# Patient Record
Sex: Female | Born: 1975 | Hispanic: Yes | Marital: Single | Smoking: Never smoker
Health system: Southern US, Community
[De-identification: ages and names within clinical notes are randomized; demographics above are authoritative.]

## PROBLEM LIST (undated history)

## (undated) DIAGNOSIS — I10 Essential (primary) hypertension: Secondary | ICD-10-CM

## (undated) DIAGNOSIS — E119 Type 2 diabetes mellitus without complications: Secondary | ICD-10-CM

## (undated) DIAGNOSIS — K529 Noninfective gastroenteritis and colitis, unspecified: Secondary | ICD-10-CM

## (undated) HISTORY — PX: ABSCESS DRAINAGE: SHX1119

## (undated) HISTORY — PX: HERNIA REPAIR: SHX51

---

## 2006-04-22 ENCOUNTER — Emergency Department: Payer: Self-pay | Admitting: Emergency Medicine

## 2008-09-17 ENCOUNTER — Emergency Department: Payer: Self-pay | Admitting: Emergency Medicine
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
## 2011-09-05 ENCOUNTER — Emergency Department: Payer: Self-pay | Admitting: *Deleted
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
## 2013-05-23 LAB — HM PAP SMEAR

## 2013-05-23 LAB — HM HIV SCREENING LAB: HM HIV Screening: NEGATIVE

## 2013-10-26 DIAGNOSIS — N879 Dysplasia of cervix uteri, unspecified: Secondary | ICD-10-CM | POA: Insufficient documentation

## 2016-09-11 ENCOUNTER — Emergency Department: Payer: Self-pay

## 2016-09-11 ENCOUNTER — Encounter: Payer: Self-pay | Admitting: Emergency Medicine

## 2016-09-11 ENCOUNTER — Emergency Department
Admission: EM | Admit: 2016-09-11 | Discharge: 2016-09-11 | Disposition: A | Discharge status: Home / Self Care | Payer: Self-pay | Attending: Emergency Medicine | Admitting: Emergency Medicine

## 2016-09-11 DIAGNOSIS — I10 Essential (primary) hypertension: Secondary | ICD-10-CM | POA: Insufficient documentation

## 2016-09-11 DIAGNOSIS — R1013 Epigastric pain: Secondary | ICD-10-CM

## 2016-09-11 DIAGNOSIS — E119 Type 2 diabetes mellitus without complications: Secondary | ICD-10-CM | POA: Insufficient documentation

## 2016-09-11 DIAGNOSIS — R740 Nonspecific elevation of levels of transaminase and lactic acid dehydrogenase [LDH]: Secondary | ICD-10-CM | POA: Insufficient documentation

## 2016-09-11 DIAGNOSIS — R7401 Elevation of levels of liver transaminase levels: Secondary | ICD-10-CM

## 2016-09-11 HISTORY — DX: Type 2 diabetes mellitus without complications: E11.9

## 2016-09-11 HISTORY — DX: Essential (primary) hypertension: I10

## 2016-09-11 HISTORY — DX: Noninfective gastroenteritis and colitis, unspecified: K52.9

## 2016-09-11 LAB — CBC
HEMATOCRIT: 34.2 % — AB (ref 35.0–47.0)
HEMOGLOBIN: 11 g/dL — AB (ref 12.0–16.0)
MCH: 22.6 pg — ABNORMAL LOW (ref 26.0–34.0)
MCHC: 32.1 g/dL (ref 32.0–36.0)
MCV: 70.4 fL — ABNORMAL LOW (ref 80.0–100.0)
Platelets: 313 10*3/uL (ref 150–440)
RBC: 4.86 MIL/uL (ref 3.80–5.20)
RDW: 17 % — ABNORMAL HIGH (ref 11.5–14.5)
WBC: 7.7 10*3/uL (ref 3.6–11.0)

## 2016-09-11 LAB — URINALYSIS, COMPLETE (UACMP) WITH MICROSCOPIC
BILIRUBIN URINE: NEGATIVE
Glucose, UA: >=500 mg/dL — AB
HGB URINE DIPSTICK: NEGATIVE
Ketones, ur: NEGATIVE mg/dL
LEUKOCYTES UA: NEGATIVE
NITRITE: NEGATIVE
PROTEIN: NEGATIVE mg/dL
Specific Gravity, Urine: 1.038 — ABNORMAL HIGH (ref 1.005–1.030)
pH: 5 (ref 5.0–8.0)

## 2016-09-11 LAB — COMPREHENSIVE METABOLIC PANEL
ALBUMIN: 3.9 g/dL (ref 3.5–5.0)
ALT: 533 U/L — ABNORMAL HIGH (ref 14–54)
ANION GAP: 8 (ref 5–15)
AST: 486 U/L — ABNORMAL HIGH (ref 15–41)
Alkaline Phosphatase: 183 U/L — ABNORMAL HIGH (ref 38–126)
BILIRUBIN TOTAL: 0.5 mg/dL (ref 0.3–1.2)
BUN: 9 mg/dL (ref 6–20)
CO2: 24 mmol/L (ref 22–32)
Calcium: 9 mg/dL (ref 8.9–10.3)
Chloride: 102 mmol/L (ref 101–111)
Creatinine, Ser: 0.5 mg/dL (ref 0.44–1.00)
GFR calc Af Amer: >60 mL/min (ref >60)
GLUCOSE: 302 mg/dL — AB (ref 65–99)
POTASSIUM: 3.6 mmol/L (ref 3.5–5.1)
Sodium: 134 mmol/L — ABNORMAL LOW (ref 135–145)
TOTAL PROTEIN: 8.3 g/dL — AB (ref 6.5–8.1)

## 2016-09-11 LAB — PROTIME-INR
INR: 0.94
Prothrombin Time: 12.6 seconds (ref 11.4–15.2)

## 2016-09-11 LAB — LIPASE, BLOOD: LIPASE: 30 U/L (ref 11–51)

## 2016-09-11 LAB — POCT PREGNANCY, URINE: PREG TEST UR: NEGATIVE

## 2016-09-11 IMAGING — CR DG ABDOMEN 2V
1 series · 2 of 2 positions shown · non-contrast
Comparison: None.

CLINICAL DATA: Epigastric pain

EXAM:
ABDOMEN - 2 VIEW

[Series 1: dg abd 2 views · 0.14mm/px · 2 of 2 slices shown]
[im 1/2]
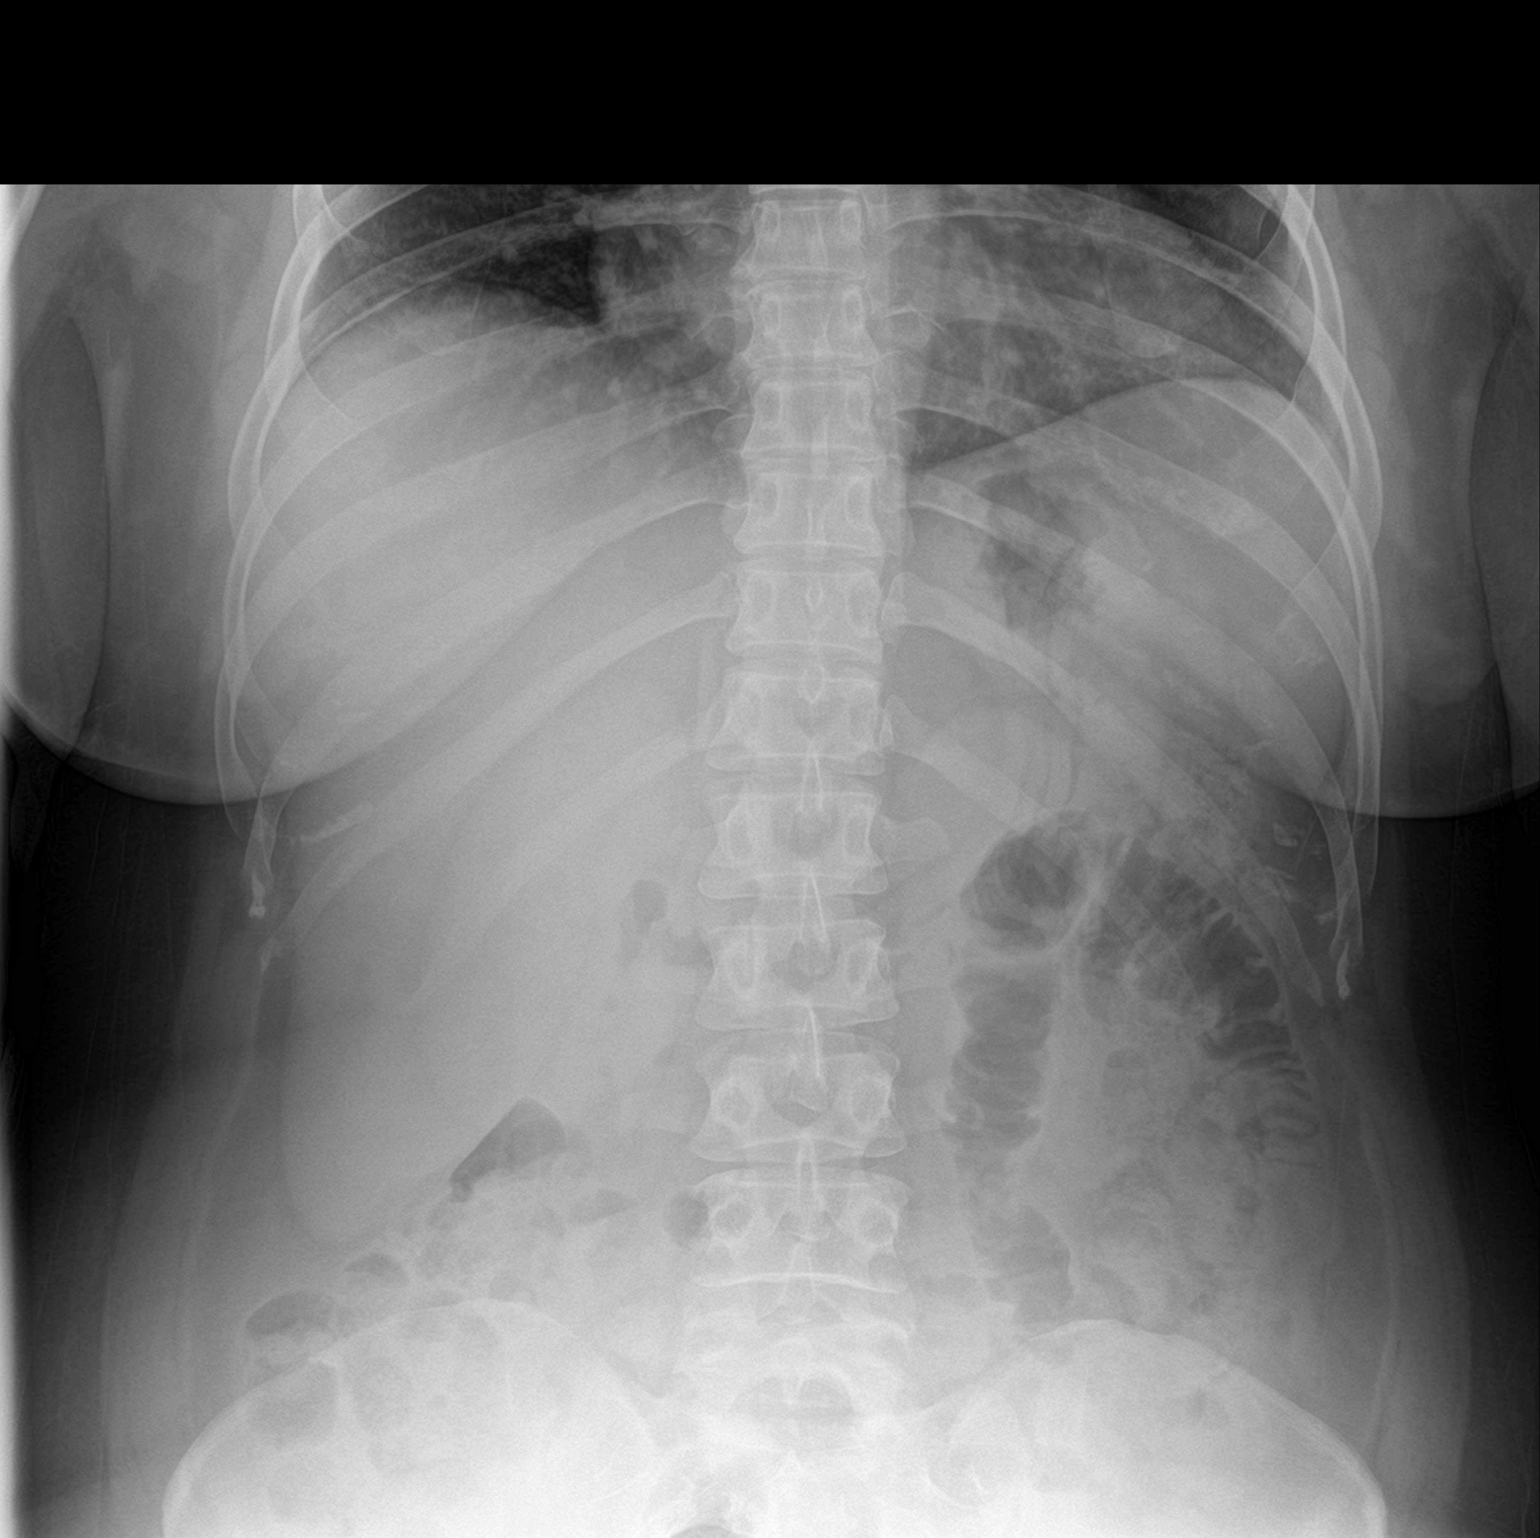
[im 2/2]
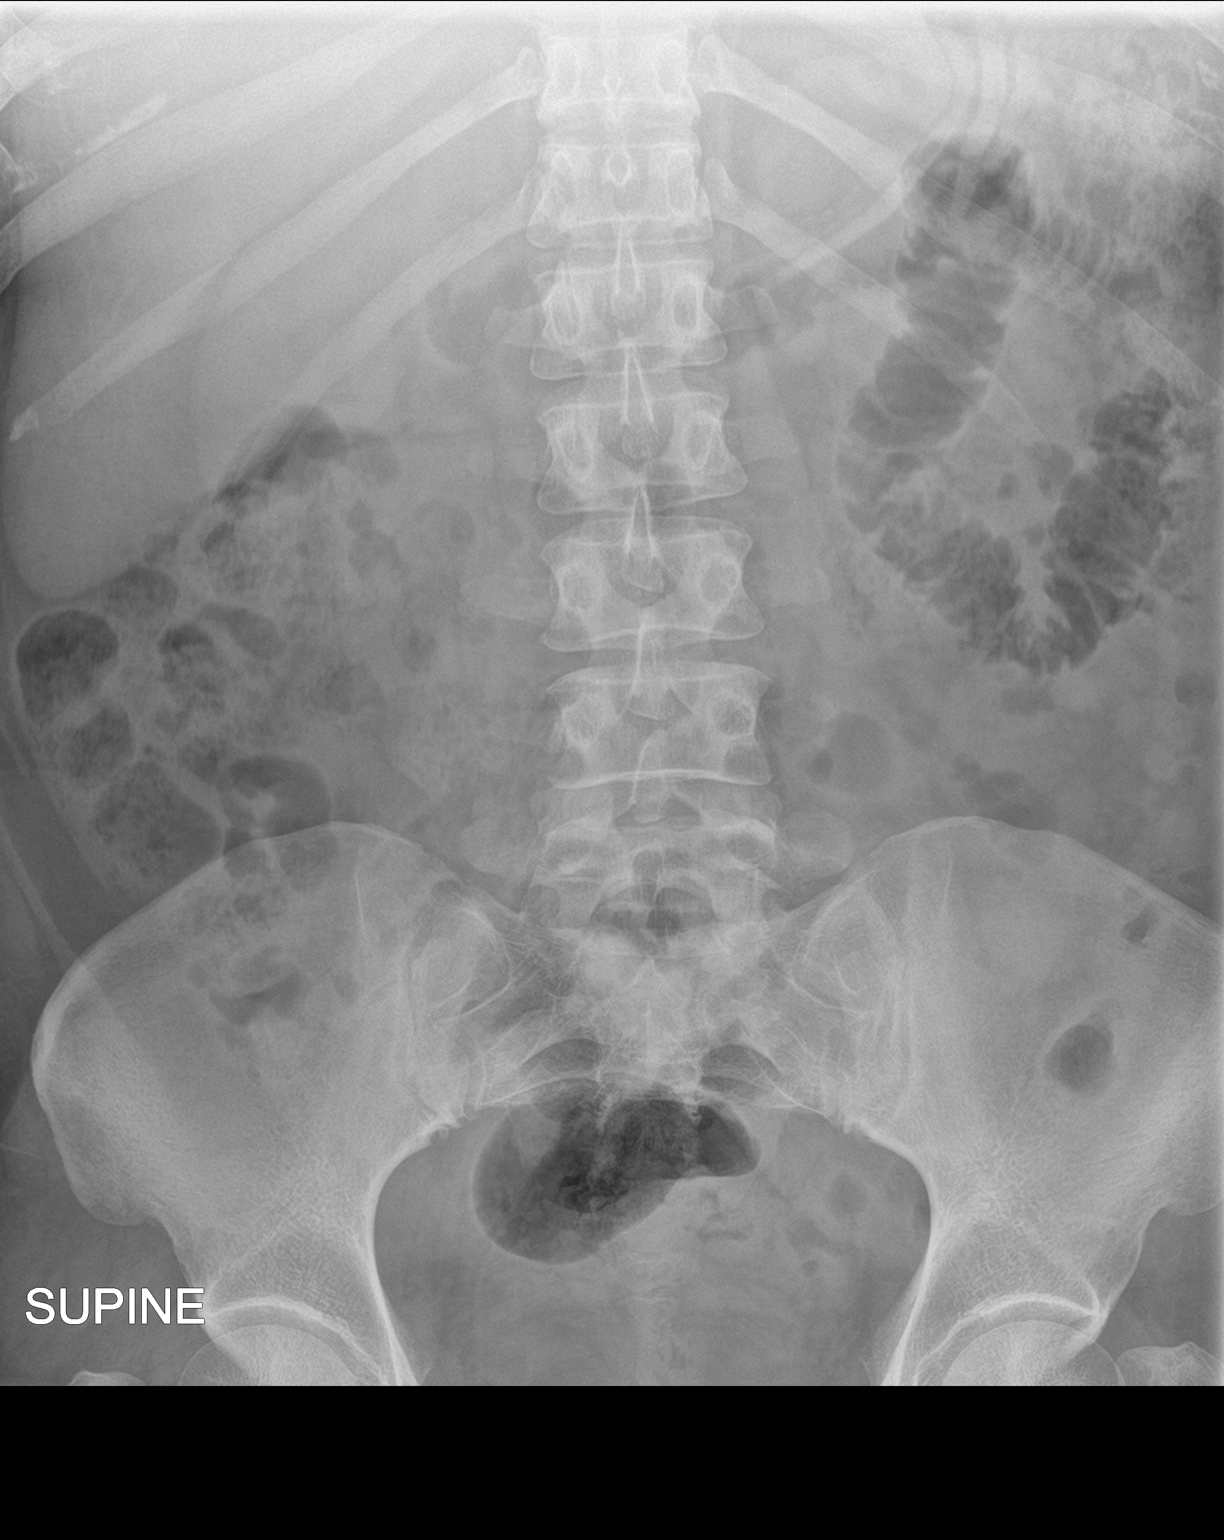

[2 of 2 positions shown; findings below may reference images not displayed]

FINDINGS: Lung bases are clear. The heart is slightly enlarged. No free air
beneath the diaphragm.

Mild gaseous enlargement of left upper quadrant small bowel but no
convincing evidence for bowel obstruction. Moderate stool in the
colon.
IMPRESSION: 1. Slight gaseous enlargement of left upper quadrant small bowel but
no definitive evidence for a bowel obstruction
2. Mild cardiomegaly

## 2016-09-11 IMAGING — US US ABDOMEN LIMITED
1 series · 14 of 25 positions shown · non-contrast
Comparison: None.

CLINICAL DATA: Epigastric pain.  Elevated LFTs.

EXAM:
ULTRASOUND ABDOMEN LIMITED RIGHT UPPER QUADRANT

[Series 1: us abdomen limited · 0.25mm/px · 14 of 51 slices shown]
[im 1/51]
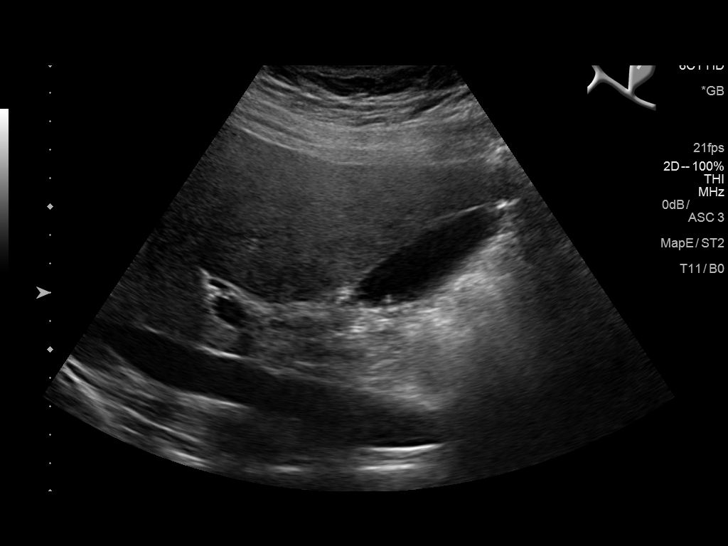
[im 5/51]
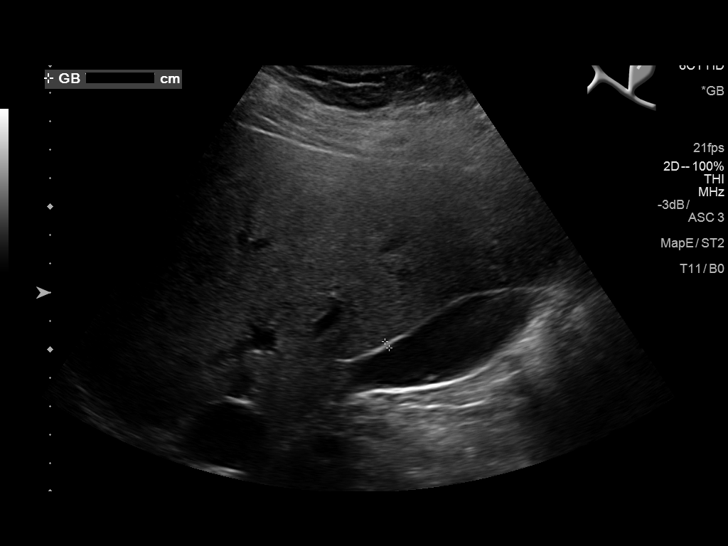
[im 9/51]
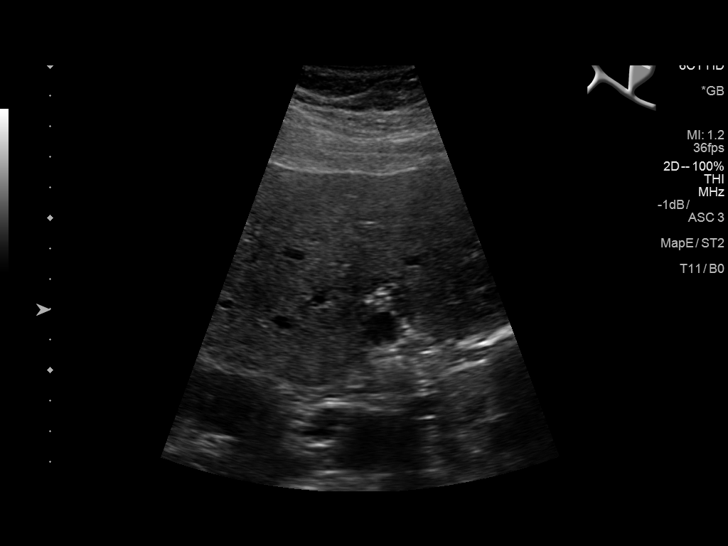
[im 13/51]
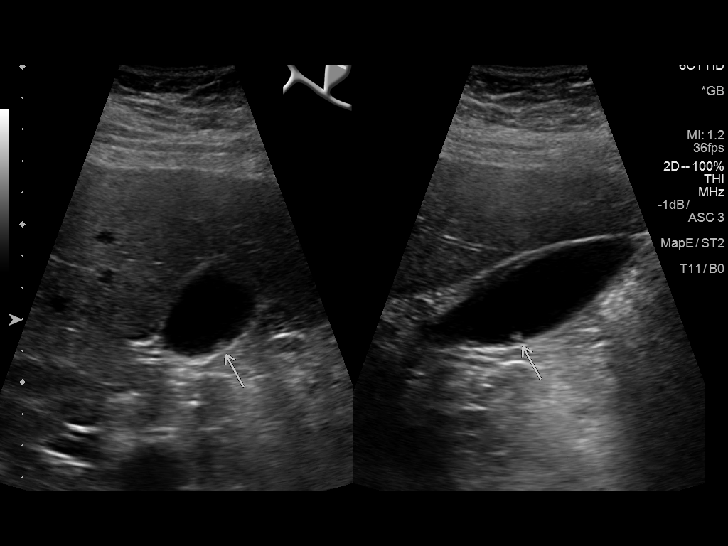
[im 17/51]
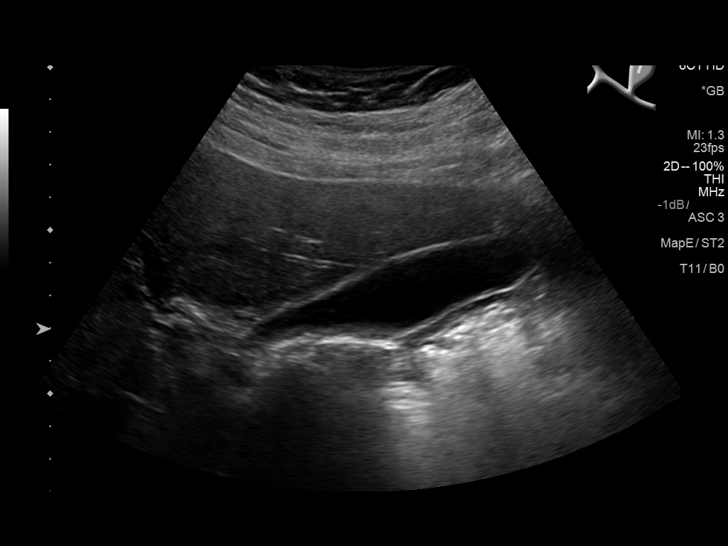
[im 19/51]
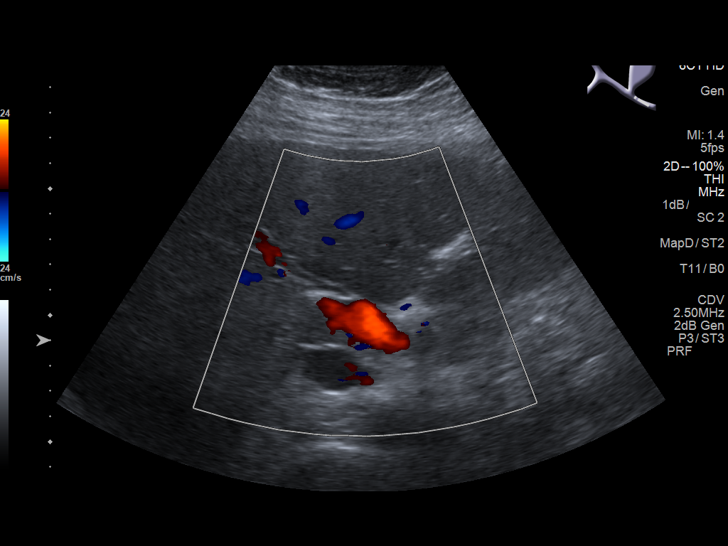
[im 23/51]
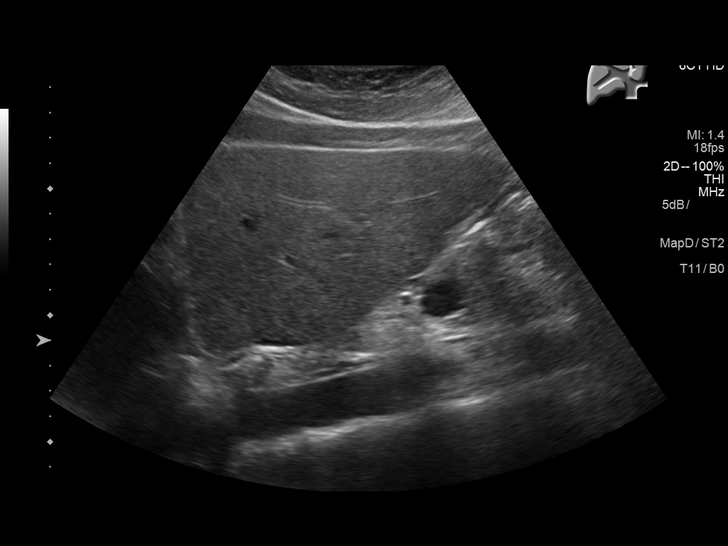
[im 28/51]
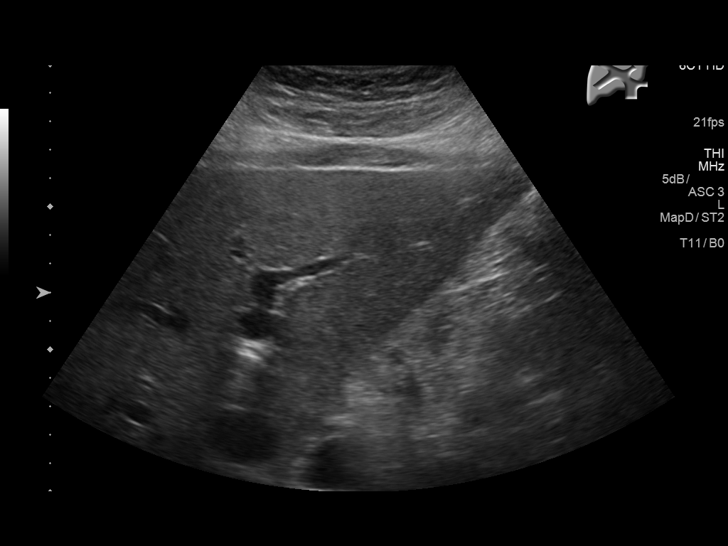
[im 32/51]
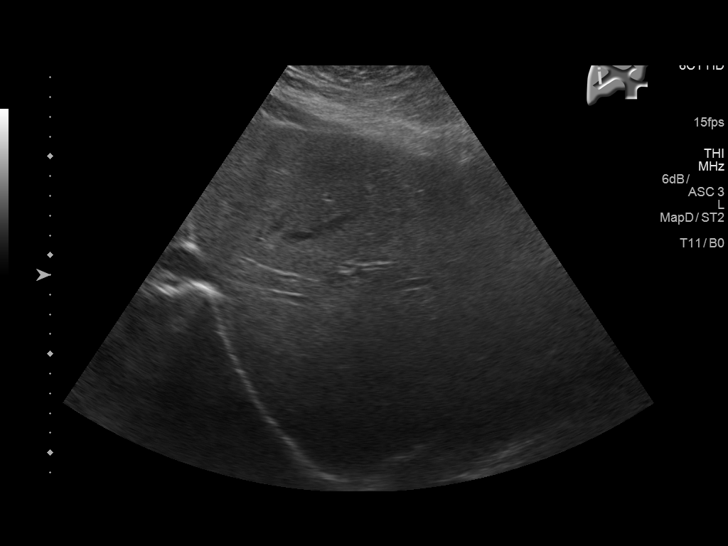
[im 34/51]
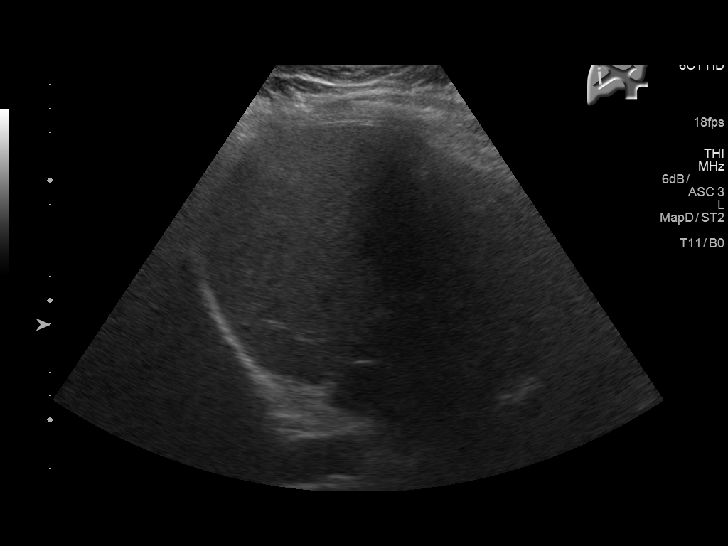
[im 38/51]
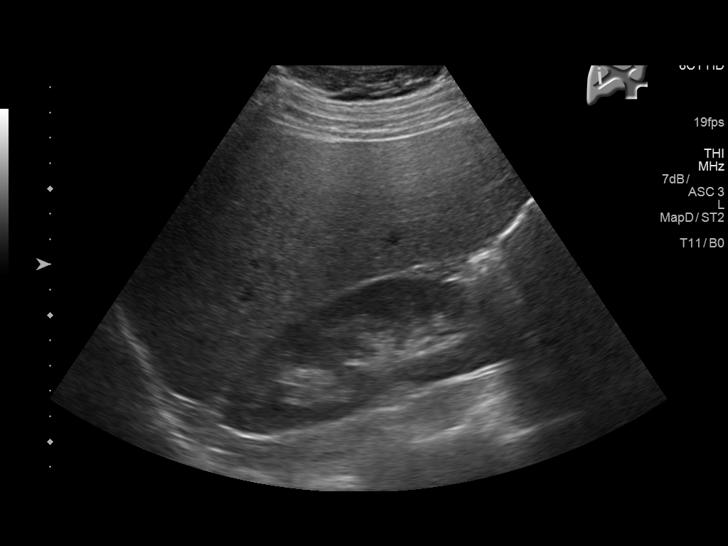
[im 42/51]
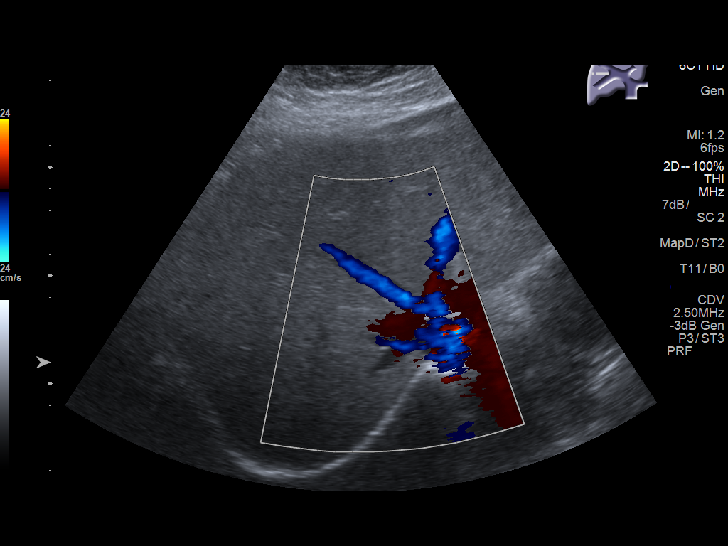
[im 46/51]
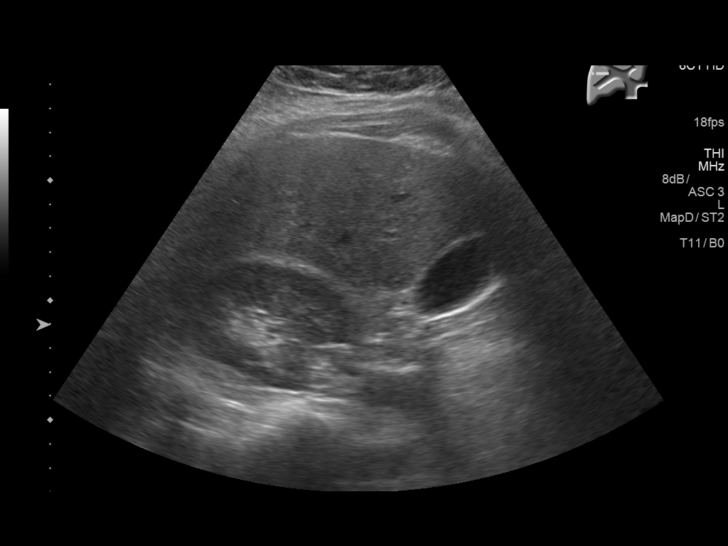
[im 51/51]
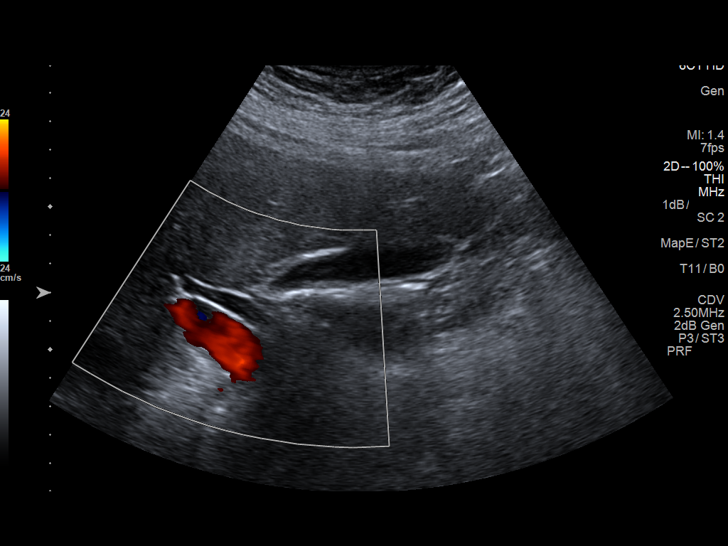

[14 of 25 positions shown; findings below may reference images not displayed]

FINDINGS: Gallbladder:

A 3.8 x 3.2 x 5.0 mm polyp is seen in the gallbladder. No stones or
sludge. No Murphy's sign. No wall thickening.

Common bile duct:

Diameter: 7.1 mm

Liver:

Probable hepatic steatosis.
IMPRESSION: 1. The common bile duct measures 7 mm which is mildly dilated in a
patient of this age. Recommend clinical correlation. If there is
concern for obstruction, recommend an MRCP.
2. 5 mm polyp in the gallbladder.
3. Probable hepatic steatosis.

## 2016-09-11 MED ORDER — OXYCODONE-ACETAMINOPHEN 5-325 MG PO TABS
2.0000 | ORAL_TABLET | Freq: Once | ORAL | Status: AC
  Administered 2016-09-11: 2 via ORAL
  Filled 2016-09-11: qty 2

## 2016-09-11 MED ORDER — PROMETHAZINE HCL 25 MG PO TABS: 25.0000 mg | ORAL_TABLET | Freq: Four times a day (QID) | ORAL | 0 refills | Status: DC | PRN

## 2016-09-11 MED ORDER — OXYCODONE HCL 5 MG PO CAPS: 5.0000 mg | ORAL_CAPSULE | ORAL | 0 refills | Status: DC | PRN

## 2016-09-11 MED ORDER — FAMOTIDINE 20 MG PO TABS: 20.0000 mg | ORAL_TABLET | Freq: Two times a day (BID) | ORAL | 1 refills | Status: DC

## 2016-09-11 MED ORDER — PANTOPRAZOLE SODIUM 40 MG PO TBEC: 40.0000 mg | DELAYED_RELEASE_TABLET | Freq: Every day | ORAL | 1 refills | Status: DC

## 2016-09-11 MED ORDER — ONDANSETRON 4 MG PO TBDP
4.0000 mg | ORAL_TABLET | Freq: Once | ORAL | Status: AC | PRN
  Administered 2016-09-11: 4 mg via ORAL
  Filled 2016-09-11: qty 1

## 2016-09-11 MED ORDER — TRAMADOL HCL 50 MG PO TABS: 50.0000 mg | ORAL_TABLET | Freq: Four times a day (QID) | ORAL | 0 refills | Status: AC | PRN

## 2016-09-11 MED ORDER — ONDANSETRON 4 MG PO TBDP: 4.0000 mg | ORAL_TABLET | Freq: Three times a day (TID) | ORAL | 0 refills | Status: DC | PRN

## 2016-09-11 MED ORDER — SUCRALFATE 1 G PO TABS: 1.0000 g | ORAL_TABLET | Freq: Four times a day (QID) | ORAL | 1 refills | Status: AC

## 2016-09-11 MED ORDER — GI COCKTAIL ~~LOC~~
30.0000 mL | Freq: Once | ORAL | Status: AC
  Administered 2016-09-11: 30 mL via ORAL
  Filled 2016-09-11: qty 30

## 2016-09-11 NOTE — ED Provider Notes (Signed)
[facility] Emergency Department Provider Note       Time seen: ----------------------------------------- 7:04 PM on 09/11/2016 -----------------------------------------     I have reviewed the triage vital signs and the nursing notes.   HISTORY   Chief Complaint Nausea and Abdominal Pain    HPI Shari Spears is a 41 y.o. female who presents to the ED for epigastric pain that began Saturday with nausea. Patient reports similar pain when she was diagnosed with colitis in [place] approximately one year ago. She doesn't history of diabetes and high blood pressure, states she was taking medication for blood pressure [place] but ran out. She denies any vomiting, has had some soft stools. Pain is currently 10 out of 10 in the abdomen.   Past Medical History:  Diagnosis Date  . Colitis   . Diabetes mellitus without complication (HCC)   . Hypertension     There are no active problems to display for this patient.   Past Surgical History:  Procedure Laterality Date  . HERNIA REPAIR      Allergies Patient has no known allergies.  Social History Social History  Substance Use Topics  . Smoking status: Never Smoker  . Smokeless tobacco: Never Used  . Alcohol use No    Review of Systems Constitutional: Negative for fever. Eyes: Negative for vision changes ENT:  Negative for congestion, sore throat Cardiovascular: Negative for chest pain. Respiratory: Negative for shortness of breath. Gastrointestinal: Positive for abdominal pain Genitourinary: Negative for dysuria. Musculoskeletal: Negative for back pain. Skin: Negative for rash. Neurological: Negative for headaches, focal weakness or numbness.  All systems negative/normal/unremarkable except as stated in the HPI  ____________________________________________   PHYSICAL EXAM:  VITAL SIGNS: ED Triage Vitals  Enc Vitals Group     BP 09/11/16 1635 (!) 154/93     Pulse Rate  09/11/16 1635 82     Resp 09/11/16 1635 20     Temp 09/11/16 1635 98.2 F (36.8 C)     Temp Source 09/11/16 1635 Oral     SpO2 09/11/16 1635 97 %     Weight 09/11/16 1636 180 lb (81.6 kg)     Height 09/11/16 1636 4\' 7"  (1.397 m)     Head Circumference --      Peak Flow --      Pain Score 09/11/16 1635 10     Pain Loc --      Pain Edu? --      Excl. in GC? --     Constitutional: Alert and oriented. Mild distress Eyes: Conjunctivae are normal. Normal extraocular movements. ENT   Head: Normocephalic and atraumatic.   Nose: No congestion/rhinnorhea.   Mouth/Throat: Mucous membranes are moist.   Neck: No stridor. Cardiovascular: Normal rate, regular rhythm. No murmurs, rubs, or gallops. Respiratory: Normal respiratory effort without tachypnea nor retractions. Breath sounds are clear and equal bilaterally. No wheezes/rales/rhonchi. Gastrointestinal: Nonfocal tenderness, no rebound or guarding. Normal bowel sounds. Musculoskeletal: Nontender with normal range of motion in extremities. No lower extremity tenderness nor edema. Neurologic:  Normal speech and language. No gross focal neurologic deficits are appreciated.  Skin:  Skin is warm, dry and intact. No rash noted. Psychiatric: Mood and affect are normal. Speech and behavior are normal.  ____________________________________________  ED COURSE:  Pertinent labs & imaging results that were available during my care of the patient were reviewed by me and considered in my medical decision making (see chart for details). Patient presents for abdominal pain, we will assess  with labs and imaging as indicated.   Procedures ____________________________________________   LABS (pertinent positives/negatives)  Labs Reviewed  COMPREHENSIVE METABOLIC PANEL - Abnormal; Notable for the following:       Result Value   Sodium 134 (*)    Glucose, Bld 302 (*)    Total Protein 8.3 (*)    AST 486 (*)    ALT 533 (*)    Alkaline  Phosphatase 183 (*)    All other components within normal limits  CBC - Abnormal; Notable for the following:    Hemoglobin 11.0 (*)    HCT 34.2 (*)    MCV 70.4 (*)    MCH 22.6 (*)    RDW 17.0 (*)    All other components within normal limits  URINALYSIS, COMPLETE (UACMP) WITH MICROSCOPIC - Abnormal; Notable for the following:    Color, Urine YELLOW (*)    APPearance HAZY (*)    Specific Gravity, Urine 1.038 (*)    Glucose, UA >=500 (*)    Bacteria, UA RARE (*)    Squamous Epithelial / LPF 0-5 (*)    All other components within normal limits  LIPASE, BLOOD  PROTIME-INR  HEPATITIS PANEL, ACUTE  POCT PREGNANCY, URINE    RADIOLOGY Images were viewed by me  Abdominal ultrasound IMPRESSION: 1. The common bile duct measures 7 mm which is mildly dilated in a patient of this age. Recommend clinical correlation. If there is concern for obstruction, recommend an MRCP. 2. 5 mm polyp in the gallbladder. 3. Probable hepatic steatosis. IMPRESSION: 1. Slight gaseous enlargement of left upper quadrant small bowel but no definitive evidence for a bowel obstruction 2. Mild cardiomegaly  ____________________________________________  FINAL ASSESSMENT AND PLAN  Epigastric pain, Elevated liver transaminases  Plan: Patient's labs and imaging were dictated above. Patient had presented for epigastric pain of uncertain etiology. Her symptoms seemed to be GERD or peptic ulcer related but she was found to have some findings for which I don't have an explanation. Common bile duct appears to be mildly dilated with a normal bilirubin and no right upper quadrant tenderness. Liver transaminases are also elevated. Her symptoms are also resolved at this point so I think she is stable for outpatient follow-up with GI. I will place her on antacid as well as give her pain medication and I have sent a hepatitis panel.   Emily FilbertWilliams, Jonathan E, MD   Note: This note was generated in part or whole with voice  recognition software. Voice recognition is usually quite accurate but there are transcription errors that can and very often do occur. I apologize for any typographical errors that were not detected and corrected.     Emily FilbertWilliams, Jonathan E, MD 09/11/16 2111

## 2016-09-11 NOTE — ED Triage Notes (Signed)
Patient presents to the ED with epigastric pain that began Saturday with nausea.  Patient reports similar pain when she was diagnosed with colitis in [place] approx. 1 year ago.  Patient also reports history of diabetes and high blood pressure.  Patient states she was taking medication for her blood pressure in [place] but patient states she ran out.  Patient is in no obvious distress at this time.  Denies vomiting for the past few days.  Patient reports some soft stools, 1 episode today.

## 2016-09-13 LAB — HEPATITIS PANEL, ACUTE
HCV Ab: <0.1 s/co ratio (ref 0.0–0.9)
HEP A IGM: NEGATIVE
HEP B C IGM: NEGATIVE
Hepatitis B Surface Ag: NEGATIVE

## 2018-08-24 DIAGNOSIS — B009 Herpesviral infection, unspecified: Secondary | ICD-10-CM | POA: Insufficient documentation

## 2018-10-07 DIAGNOSIS — E669 Obesity, unspecified: Secondary | ICD-10-CM | POA: Insufficient documentation

## 2019-02-25 ENCOUNTER — Emergency Department: Payer: Medicaid Other

## 2019-02-25 ENCOUNTER — Other Ambulatory Visit: Payer: Self-pay

## 2019-02-25 ENCOUNTER — Emergency Department
Admission: EM | Admit: 2019-02-25 | Discharge: 2019-02-25 | Disposition: A | Discharge status: Other Acute Inpatient Hospital | Payer: Medicaid Other | Attending: Emergency Medicine | Admitting: Emergency Medicine

## 2019-02-25 DIAGNOSIS — Z79899 Other long term (current) drug therapy: Secondary | ICD-10-CM | POA: Insufficient documentation

## 2019-02-25 DIAGNOSIS — Y763 Surgical instruments, materials and obstetric and gynecological devices (including sutures) associated with adverse incidents: Secondary | ICD-10-CM | POA: Diagnosis not present

## 2019-02-25 DIAGNOSIS — Z20828 Contact with and (suspected) exposure to other viral communicable diseases: Secondary | ICD-10-CM | POA: Insufficient documentation

## 2019-02-25 DIAGNOSIS — R509 Fever, unspecified: Secondary | ICD-10-CM | POA: Diagnosis present

## 2019-02-25 DIAGNOSIS — T8130XA Disruption of wound, unspecified, initial encounter: Secondary | ICD-10-CM

## 2019-02-25 DIAGNOSIS — O900 Disruption of cesarean delivery wound: Secondary | ICD-10-CM | POA: Diagnosis not present

## 2019-02-25 DIAGNOSIS — E119 Type 2 diabetes mellitus without complications: Secondary | ICD-10-CM | POA: Diagnosis not present

## 2019-02-25 DIAGNOSIS — O2413 Pre-existing diabetes mellitus, type 2, in the puerperium: Secondary | ICD-10-CM | POA: Insufficient documentation

## 2019-02-25 DIAGNOSIS — K659 Peritonitis, unspecified: Secondary | ICD-10-CM

## 2019-02-25 DIAGNOSIS — O1093 Unspecified pre-existing hypertension complicating the puerperium: Secondary | ICD-10-CM | POA: Diagnosis not present

## 2019-02-25 DIAGNOSIS — T8140XA Infection following a procedure, unspecified, initial encounter: Secondary | ICD-10-CM | POA: Diagnosis not present

## 2019-02-25 DIAGNOSIS — O99893 Other specified diseases and conditions complicating puerperium: Secondary | ICD-10-CM | POA: Insufficient documentation

## 2019-02-25 DIAGNOSIS — A419 Sepsis, unspecified organism: Secondary | ICD-10-CM | POA: Diagnosis not present

## 2019-02-25 DIAGNOSIS — T8149XA Infection following a procedure, other surgical site, initial encounter: Secondary | ICD-10-CM

## 2019-02-25 LAB — CBC WITH DIFFERENTIAL/PLATELET
Abs Immature Granulocytes: 0.11 10*3/uL — ABNORMAL HIGH (ref 0.00–0.07)
Basophils Absolute: 0 10*3/uL (ref 0.0–0.1)
Basophils Relative: 0 %
Eosinophils Absolute: 0 10*3/uL (ref 0.0–0.5)
Eosinophils Relative: 0 %
HCT: 24.5 % — ABNORMAL LOW (ref 36.0–46.0)
Hemoglobin: 7.6 g/dL — ABNORMAL LOW (ref 12.0–15.0)
Immature Granulocytes: 1 %
Lymphocytes Relative: 12 %
Lymphs Abs: 1.7 10*3/uL (ref 0.7–4.0)
MCH: 24.8 pg — ABNORMAL LOW (ref 26.0–34.0)
MCHC: 31 g/dL (ref 30.0–36.0)
MCV: 79.8 fL — ABNORMAL LOW (ref 80.0–100.0)
Monocytes Absolute: 0.8 10*3/uL (ref 0.1–1.0)
Monocytes Relative: 5 %
Neutro Abs: 12.3 10*3/uL — ABNORMAL HIGH (ref 1.7–7.7)
Neutrophils Relative %: 82 %
Platelets: 457 10*3/uL — ABNORMAL HIGH (ref 150–400)
RBC: 3.07 MIL/uL — ABNORMAL LOW (ref 3.87–5.11)
RDW: 20.9 % — ABNORMAL HIGH (ref 11.5–15.5)
WBC: 15 10*3/uL — ABNORMAL HIGH (ref 4.0–10.5)
nRBC: 0.5 % — ABNORMAL HIGH (ref 0.0–0.2)

## 2019-02-25 LAB — URINALYSIS, COMPLETE (UACMP) WITH MICROSCOPIC
Bacteria, UA: NONE SEEN
Bilirubin Urine: NEGATIVE
Glucose, UA: 50 mg/dL — AB
Hgb urine dipstick: NEGATIVE
Ketones, ur: NEGATIVE mg/dL
Nitrite: NEGATIVE
Protein, ur: NEGATIVE mg/dL
Specific Gravity, Urine: 1.038 — ABNORMAL HIGH (ref 1.005–1.030)
pH: 6 (ref 5.0–8.0)

## 2019-02-25 LAB — LIPASE, BLOOD: Lipase: 21 U/L (ref 11–51)

## 2019-02-25 LAB — BRAIN NATRIURETIC PEPTIDE: B Natriuretic Peptide: 39 pg/mL (ref 0.0–100.0)

## 2019-02-25 LAB — COMPREHENSIVE METABOLIC PANEL
ALT: 21 U/L (ref 0–44)
AST: 16 U/L (ref 15–41)
Albumin: 2.8 g/dL — ABNORMAL LOW (ref 3.5–5.0)
Alkaline Phosphatase: 153 U/L — ABNORMAL HIGH (ref 38–126)
Anion gap: 7 (ref 5–15)
BUN: 15 mg/dL (ref 6–20)
CO2: 20 mmol/L — ABNORMAL LOW (ref 22–32)
Calcium: 8.1 mg/dL — ABNORMAL LOW (ref 8.9–10.3)
Chloride: 107 mmol/L (ref 98–111)
Creatinine, Ser: 0.51 mg/dL (ref 0.44–1.00)
GFR calc Af Amer: >60 mL/min (ref >60)
GFR calc non Af Amer: >60 mL/min (ref >60)
Glucose, Bld: 227 mg/dL — ABNORMAL HIGH (ref 70–99)
Potassium: 3.7 mmol/L (ref 3.5–5.1)
Sodium: 134 mmol/L — ABNORMAL LOW (ref 135–145)
Total Bilirubin: 0.4 mg/dL (ref 0.3–1.2)
Total Protein: 7.4 g/dL (ref 6.5–8.1)

## 2019-02-25 LAB — RESPIRATORY PANEL BY RT PCR (FLU A&B, COVID)
Influenza A by PCR: NEGATIVE
Influenza B by PCR: NEGATIVE
SARS Coronavirus 2 by RT PCR: NEGATIVE

## 2019-02-25 LAB — LACTIC ACID, PLASMA
Lactic Acid, Venous: 1.2 mmol/L (ref 0.5–1.9)
Lactic Acid, Venous: 1.1 mmol/L (ref 0.5–1.9)

## 2019-02-25 LAB — PROCALCITONIN: Procalcitonin: <0.1 ng/mL

## 2019-02-25 IMAGING — CT CT ABD-PELV W/ CM
2 of 5 series · 16 of 46 positions shown, 18 images · IV contrast (APPLIED)
Comparison: None.

CLINICAL DATA: Acute generalized abdominal pain. Eight days post
cesarean section. Leaking clear fluid from right-sided incision.

EXAM:
CT ABDOMEN AND PELVIS WITH CONTRAST
TECHNIQUE: Multidetector CT imaging of the abdomen and pelvis was performed
using the standard protocol following bolus administration of
intravenous contrast.
CONTRAST:  100mL OMNIPAQUE IOHEXOL 300 MG/ML  SOLN

[Series 2: routine abd/pel with · axial · 0.92mm/px · z∈[-1302,-872]mm · 13 of 100 slices shown, 15 images]
[im 7/100  soft-tissue]
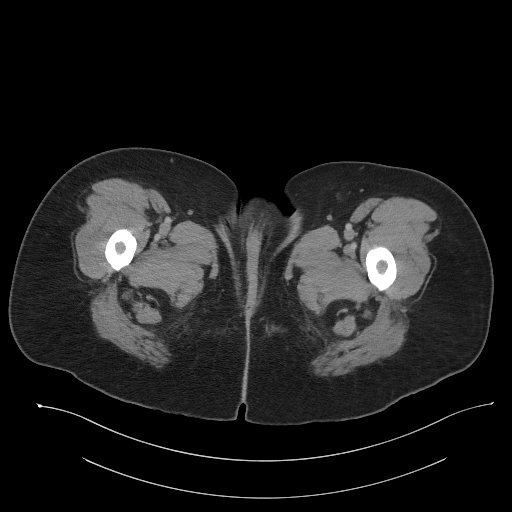
[im 7/100  bone]
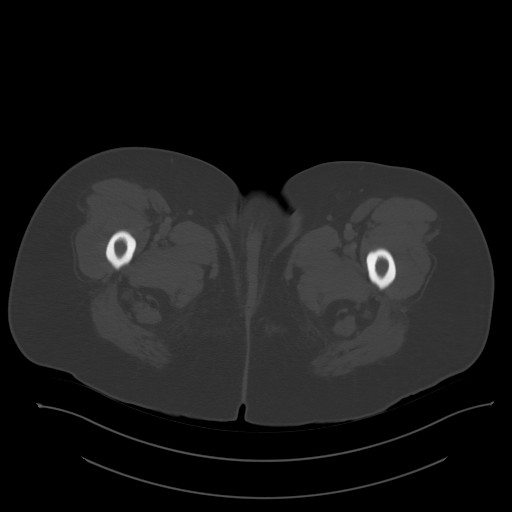
[im 14/100  soft-tissue]
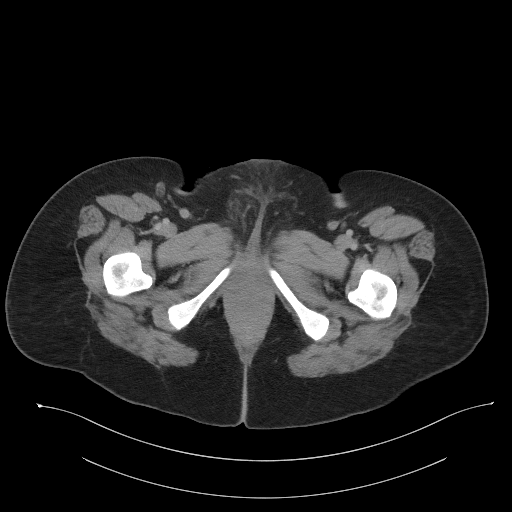
[im 20/100  soft-tissue]
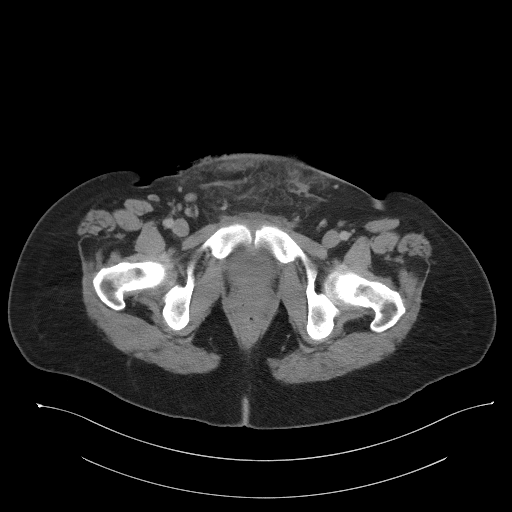
[im 27/100  soft-tissue]
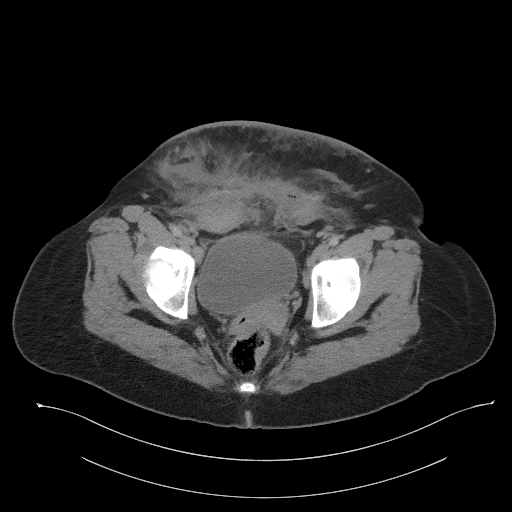
[im 34/100  soft-tissue]
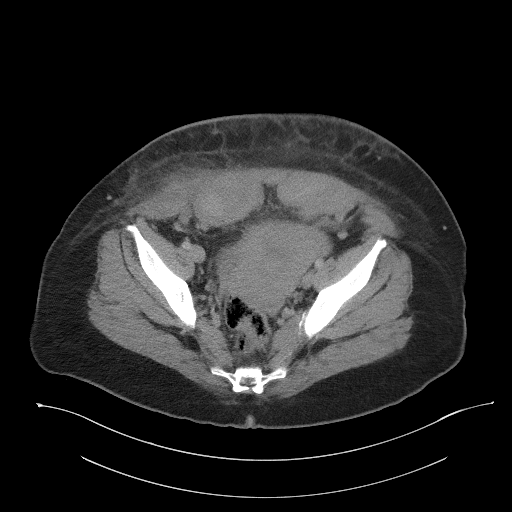
[im 40/100  soft-tissue]
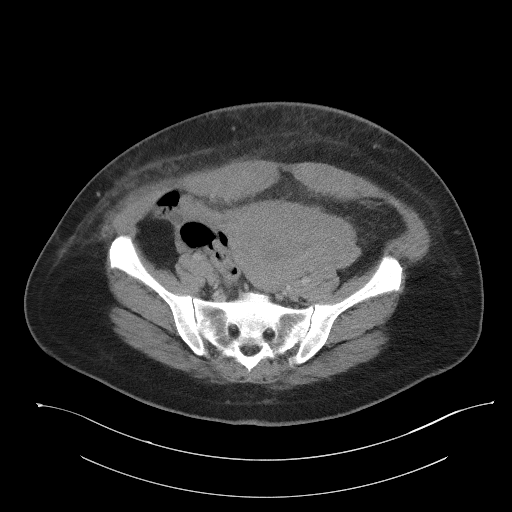
[im 53/100  soft-tissue]
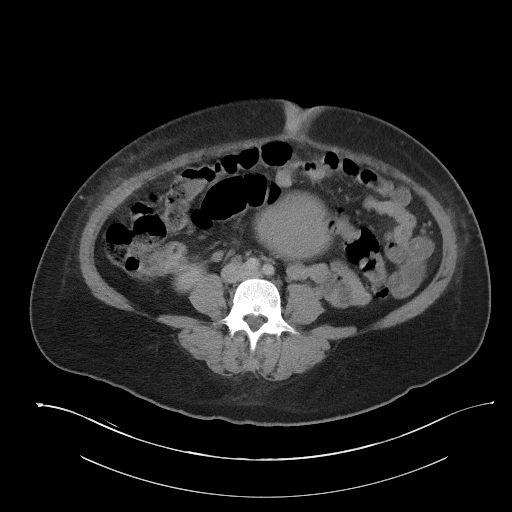
[im 60/100  soft-tissue]
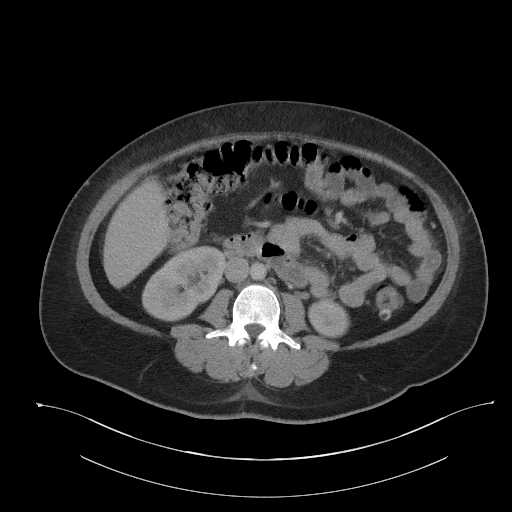
[im 67/100  soft-tissue]
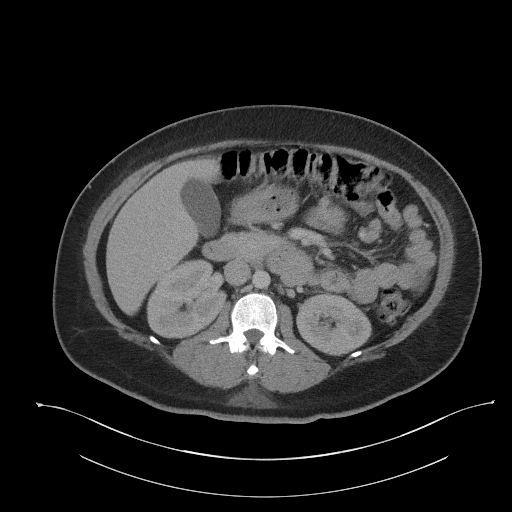
[im 67/100  bone]
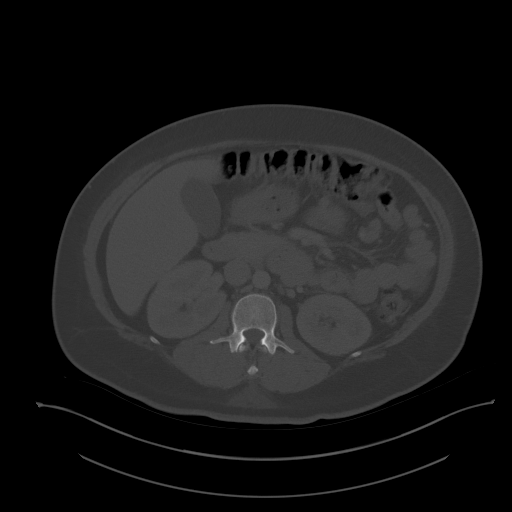
[im 73/100  soft-tissue]
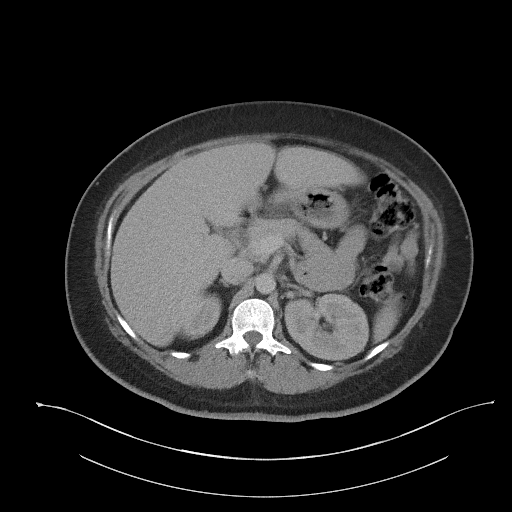
[im 80/100  soft-tissue]
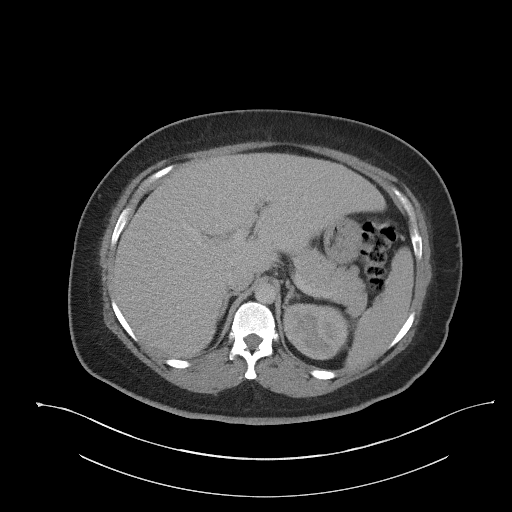
[im 86/100  soft-tissue]
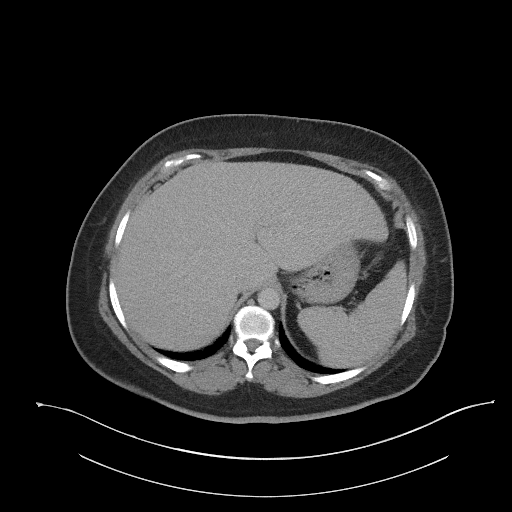
[im 93/100  soft-tissue]
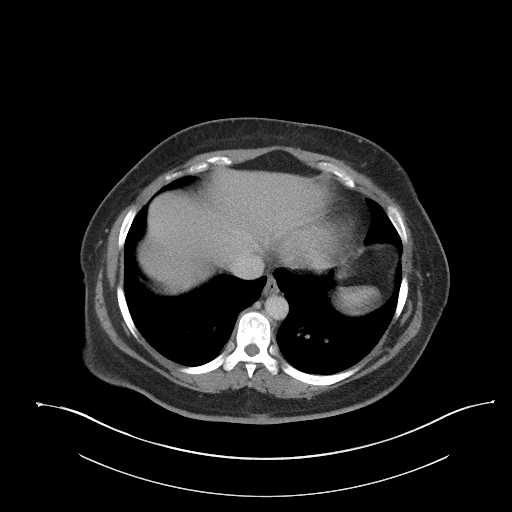

[Series 5: coronal st · coronal · 0.83mm/px · 3 of 96 slices shown]
[im 32/96  soft-tissue]
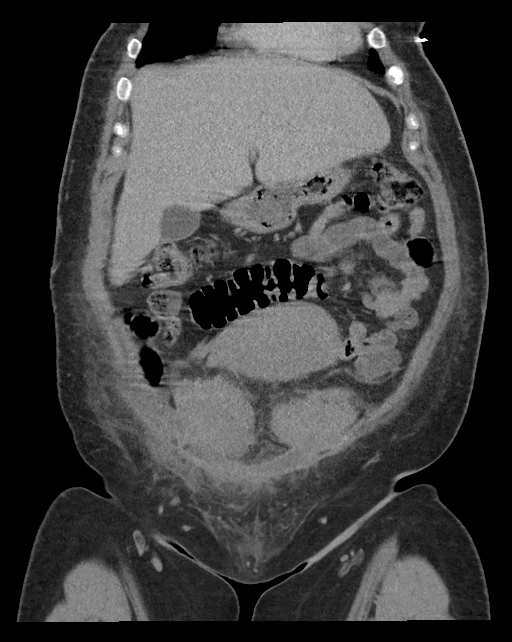
[im 43/96  soft-tissue]
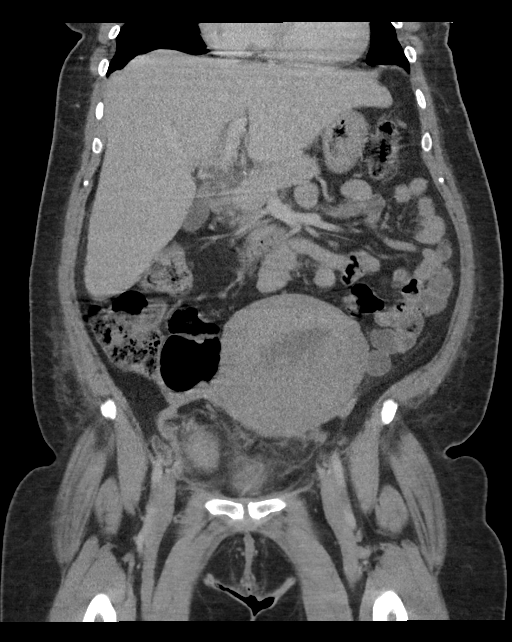
[im 53/96  soft-tissue]
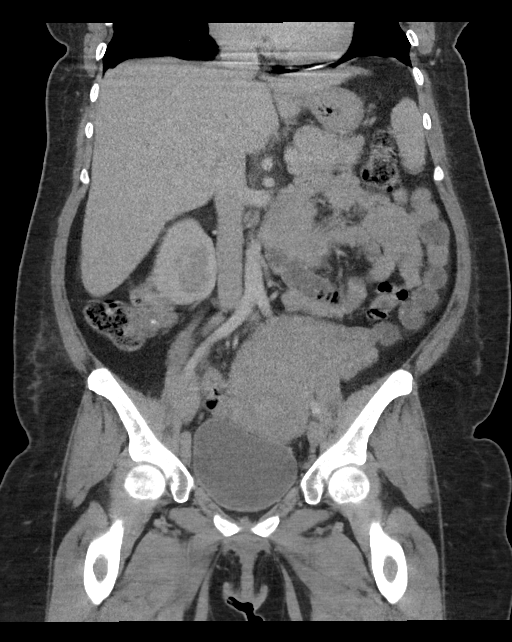

[16 of 46 positions shown; findings below may reference images not displayed]

FINDINGS: Lower chest:  No contributory findings.

Hepatobiliary: No focal liver abnormality.No evidence of biliary
obstruction or stone.

Pancreas: Unremarkable.

Spleen: Unremarkable.

Adrenals/Urinary Tract: Negative adrenals. No hydronephrosis or
stone. Unremarkable bladder.

Stomach/Bowel:  No obstruction. No appendicitis.

Vascular/Lymphatic: No acute vascular abnormality. No mass or
adenopathy.

Reproductive:Large uterus in keeping with recent delivery. Some
high-density material is in the lower uterine segment compatible
with blood clot, expected. No endometrial gas. High-density
thickening at the level of the abdominal wall incision with the
hemorrhage indistinguishable from the bilateral lower rectus
abdominus, larger on the right with a 8 cm craniocaudal extent and
approximately 4 cm anterior to posterior thickness. There is patchy
subcutaneous stranding in the right more than left subcutaneous
space, the presumed site of leakage. Sterility is indeterminate,
aside from the hematoma is no organized collection. A transverse
band of high-density is attributed to suture material as it follows
the deep fascial air. No evidence of active hemorrhage.

Other: No ascites or pneumoperitoneum.

Musculoskeletal: No acute abnormalities.
IMPRESSION: Recent cesarean section with hematomas in the bilateral rectus
sheath. There is some liquefaction of the hematomas with abdominal
wall stranding, the presumed cause of wound leakage.

## 2019-02-25 MED ORDER — VANCOMYCIN HCL IN DEXTROSE 750-5 MG/150ML-% IV SOLN: 750.0000 mg | Freq: Two times a day (BID) | INTRAVENOUS | Status: DC

## 2019-02-25 MED ORDER — SODIUM CHLORIDE 0.9 % IV BOLUS (SEPSIS)
1000.0000 mL | Freq: Once | INTRAVENOUS | Status: AC
  Administered 2019-02-25: 04:00:00 1000 mL via INTRAVENOUS

## 2019-02-25 MED ORDER — METRONIDAZOLE IN NACL 5-0.79 MG/ML-% IV SOLN
500.0000 mg | Freq: Once | INTRAVENOUS | Status: AC
  Administered 2019-02-25: 500 mg via INTRAVENOUS
  Filled 2019-02-25: qty 100

## 2019-02-25 MED ORDER — VANCOMYCIN HCL IN DEXTROSE 1-5 GM/200ML-% IV SOLN
1000.0000 mg | Freq: Once | INTRAVENOUS | Status: AC
  Administered 2019-02-25: 04:00:00 1000 mg via INTRAVENOUS
  Filled 2019-02-25: qty 200

## 2019-02-25 MED ORDER — SODIUM CHLORIDE 0.9 % IV SOLN
2.0000 g | INTRAVENOUS | Status: AC
  Administered 2019-02-25: 04:00:00 2 g via INTRAVENOUS
  Filled 2019-02-25: qty 20

## 2019-02-25 MED ORDER — MORPHINE SULFATE (PF) 4 MG/ML IV SOLN
4.0000 mg | Freq: Once | INTRAVENOUS | Status: AC
  Administered 2019-02-25: 4 mg via INTRAVENOUS
  Filled 2019-02-25: qty 1

## 2019-02-25 MED ORDER — ONDANSETRON HCL 4 MG/2ML IJ SOLN
4.0000 mg | INTRAMUSCULAR | Status: AC
  Administered 2019-02-25: 4 mg via INTRAVENOUS
  Filled 2019-02-25: qty 2

## 2019-02-25 MED ORDER — ACETAMINOPHEN 500 MG PO TABS
1000.0000 mg | ORAL_TABLET | Freq: Once | ORAL | Status: AC
  Administered 2019-02-25: 1000 mg via ORAL
  Filled 2019-02-25: qty 2

## 2019-02-25 MED ORDER — IOHEXOL 300 MG/ML  SOLN
100.0000 mL | Freq: Once | INTRAMUSCULAR | Status: AC | PRN
  Administered 2019-02-25: 100 mL via INTRAVENOUS

## 2019-02-25 NOTE — Progress Notes (Signed)
Pharmacy Antibiotic Note  Shari Spears is a 43 y.o. female admitted on 02/25/2019 with cellulitis.  Pharmacy has been consulted for Vancomycin dosing. Pt received Vancomycin 1gm in ED. Noted:  Intra-abdominal - 8 days after C-section, now with obvious post-op infection (draining purulence from wound dehiscence)  Plan: Vancomycin 750 mg IV Q 12 hrs. Goal AUC 400-550. Expected AUC: 519.1 SCr used: 0.8   Height: 5' (152.4 cm) Weight: 150 lb (68 kg) IBW/kg (Calculated) : 45.5  Temp (24hrs), Avg:99.7 F (37.6 C), Min:99.7 F (37.6 C), Max:99.7 F (37.6 C)  Recent Labs  Lab 02/25/19 0219 02/25/19 0336  WBC 15.0*  --   CREATININE 0.51  --   LATICACIDVEN 1.2 1.1    Estimated Creatinine Clearance: 78.8 mL/min (by C-G formula based on SCr of 0.51 mg/dL).    No Known Allergies  Antimicrobials this admission: Rocephin 12/11 x 1 Vancomycin 12/11 >>   Dose adjustments this admission:   Microbiology results:  BCx:   UCx:    Sputum:    MRSA PCR:   Thank you for allowing pharmacy to be a part of this patient's care.  Hart Robinsons A 02/25/2019 4:28 AM

## 2019-02-25 NOTE — ED Notes (Signed)
Urine sent to lab at this time.

## 2019-02-25 NOTE — ED Notes (Signed)
Pt transported to CT ?

## 2019-02-25 NOTE — ED Triage Notes (Signed)
Pt to the er 8 days post cesarean section. 30 minutes ago pt had chills and felt wet but pt realized she is wet from incision site on the right side. Pt has clear fluid leaking currently on the right side. Pt has pain at the incision site.

## 2019-02-25 NOTE — ED Provider Notes (Signed)
[facility] Emergency Department Provider Note  ____________________________________________   First MD Initiated Contact with Patient 02/25/19 0308     (approximate)  I have reviewed the triage vital signs and the nursing notes.   HISTORY  Chief Complaint Post-op Problem  The patient and/or family speak(s) Spanish.  They understand they have the right to the use of a hospital interpreter, however at this time they prefer to speak directly with me in [place].  They know that they can ask for an interpreter at any time.   HPI Iysis Shari Spears is a 43 y.o. female who is 8 days status post cesarean section at [facility] who presents for evaluation of fever and worsening pain in her lower abdomen as well as draining liquid from the surgical incision.  She reports that she has had persistent pain in the right lower part of her abdomen since the surgery but it is gotten worse and is currently severe, sharp and throbbing.  Tonight she noticed that she was leaking fluid and discovered it is from a small area on the right side of the incision that is open and draining clear fluid.  Any amount of movement makes the pain worse and nothing particular makes it better.  She has also had fever of about 100.5 measured at home and multiple episodes of shaking chills over the last couple of days.  She reports that symptoms are severe.  She has not had the opportunity to speak with her provider at [facility] but she was planning on calling them tomorrow but she felt too bad tonight so she came in.  She denies nausea, vomiting, chest pain, cough, shortness of breath.  She has been breast-feeding but the baby is also taking formula.  No dysuria.  No excessive vaginal bleeding.         Past Medical History:  Diagnosis Date  . Colitis   . Diabetes mellitus without complication ([place])   . Hypertension     There are no problems to display for this patient.   Past Surgical History:    Procedure Laterality Date  . CESAREAN SECTION    . HERNIA REPAIR      Prior to Admission medications   Medication Sig Start Date End Date Taking? Authorizing Provider  famotidine (PEPCID) 20 MG tablet Take 1 tablet (20 mg total) by mouth 2 (two) times daily. 09/11/16   Earleen Newport, MD  ondansetron (ZOFRAN ODT) 4 MG disintegrating tablet Take 1 tablet (4 mg total) by mouth every 8 (eight) hours as needed for nausea or vomiting. 09/11/16   Earleen Newport, MD  oxycodone (OXY-IR) 5 MG capsule Take 1 capsule (5 mg total) by mouth every 4 (four) hours as needed. 09/11/16   Earleen Newport, MD  pantoprazole (PROTONIX) 40 MG tablet Take 1 tablet (40 mg total) by mouth daily. 09/11/16 09/11/17  Earleen Newport, MD  promethazine (PHENERGAN) 25 MG tablet Take 1 tablet (25 mg total) by mouth every 6 (six) hours as needed for nausea or vomiting. 09/11/16   Earleen Newport, MD  sucralfate (CARAFATE) 1 g tablet Take 1 tablet (1 g total) by mouth 4 (four) times daily. 09/11/16 09/11/17  Earleen Newport, MD    Allergies Patient has no known allergies.  No family history on file.  Social History Social History   Tobacco Use  . Smoking status: Never Smoker  . Smokeless tobacco: Never Used  Substance Use Topics  . Alcohol use: No  .  Drug use: No    Review of Systems Constitutional: +fever/chills Eyes: No visual changes. ENT: No sore throat. Cardiovascular: Denies chest pain. Respiratory: Denies shortness of breath. Gastrointestinal: Lower abdominal pain that is now severe, no nausea or vomiting or diarrhea. Genitourinary: Negative for dysuria. Musculoskeletal: Negative for neck pain.  Negative for back pain. Integumentary: Small area of open C-section wound that is draining fluid. Neurological: Negative for headaches, focal weakness or numbness.   ____________________________________________   PHYSICAL EXAM:  VITAL SIGNS: ED Triage Vitals  Enc Vitals Group      BP 02/25/19 0208 123/82     Pulse Rate 02/25/19 0208 (!) 113     Resp 02/25/19 0208 20     Temp 02/25/19 0208 99.7 F (37.6 C)     Temp Source 02/25/19 0208 Oral     SpO2 02/25/19 0208 98 %     Weight 02/25/19 0209 68 kg (150 lb)     Height 02/25/19 0209 1.524 m (5')     Head Circumference --      Peak Flow --      Pain Score 02/25/19 0340 10     Pain Loc --      Pain Edu? --      Excl. in GC? --     Constitutional: Alert and oriented.  Appears uncomfortable but nontoxic. Eyes: Conjunctivae are normal.  Head: Atraumatic. Nose: No congestion/rhinnorhea. Mouth/Throat: Patient is wearing a mask. Neck: No stridor.  No meningeal signs.   Cardiovascular: Normal rate, regular rhythm. Good peripheral circulation. Grossly normal heart sounds. Respiratory: Normal respiratory effort.  No retractions. Gastrointestinal: Obese.  Severe tenderness palpation of the lower abdomen.  The C-section wound is partially dehisced on the right side of the wound and while the wound itself is essentially normal in appearance, it is draining serous fluid.  With any amount of palpation around the wound, extremely foul-smelling purulence is easily expressed from the wound dehiscence. Musculoskeletal: No lower extremity tenderness nor edema. No gross deformities of extremities. Neurologic:  Normal speech and language. No gross focal neurologic deficits are appreciated.  Skin:  Skin is warm, dry and intact except as described above with the C-section wound dehiscence. Psychiatric: Mood and affect are normal. Speech and behavior are normal.  ____________________________________________   LABS (all labs ordered are listed, but only abnormal results are displayed)  Labs Reviewed  CBC WITH DIFFERENTIAL/PLATELET - Abnormal; Notable for the following components:      Result Value   WBC 15.0 (*)    RBC 3.07 (*)    Hemoglobin 7.6 (*)    HCT 24.5 (*)    MCV 79.8 (*)    MCH 24.8 (*)    RDW 20.9 (*)     Platelets 457 (*)    nRBC 0.5 (*)    Neutro Abs 12.3 (*)    Abs Immature Granulocytes 0.11 (*)    All other components within normal limits  COMPREHENSIVE METABOLIC PANEL - Abnormal; Notable for the following components:   Sodium 134 (*)    CO2 20 (*)    Glucose, Bld 227 (*)    Calcium 8.1 (*)    Albumin 2.8 (*)    Alkaline Phosphatase 153 (*)    All other components within normal limits  URINALYSIS, COMPLETE (UACMP) WITH MICROSCOPIC - Abnormal; Notable for the following components:   Color, Urine YELLOW (*)    APPearance CLEAR (*)    Specific Gravity, Urine 1.038 (*)    Glucose, UA 50 (*)  Leukocytes,Ua TRACE (*)    All other components within normal limits  CULTURE, BLOOD (ROUTINE X 2)  CULTURE, BLOOD (ROUTINE X 2)  RESPIRATORY PANEL BY RT PCR (FLU A&B, COVID)  URINE CULTURE  AEROBIC/ANAEROBIC CULTURE (SURGICAL/DEEP WOUND)  LACTIC ACID, PLASMA  LACTIC ACID, PLASMA  LIPASE, BLOOD  BRAIN NATRIURETIC PEPTIDE  PROCALCITONIN  PROTIME-INR   ____________________________________________  EKG  None - EKG not ordered by ED physician ____________________________________________  RADIOLOGY Marylou Mccoy, personally viewed and evaluated these images (plain radiographs) as part of my medical decision making, as well as reviewing the written report by the radiologist.  I also discussed the case by phone with the radiologist, Dr. Grace Isaac.  ED MD interpretation: Extensive area of what appears to be infected hematoma extending below the deep fascia.  Official radiology report(s): CT Abdomen Pelvis W Contrast  Result Date: 02/25/2019 CLINICAL DATA:  Acute generalized abdominal pain. Eight days post cesarean section. Leaking clear fluid from right-sided incision. EXAM: CT ABDOMEN AND PELVIS WITH CONTRAST TECHNIQUE: Multidetector CT imaging of the abdomen and pelvis was performed using the standard protocol following bolus administration of intravenous contrast. CONTRAST:   OMNIPAQUE IOHEXOL 300 MG/ML  SOLN COMPARISON:  None. FINDINGS: Lower chest:  No contributory findings. Hepatobiliary: No focal liver abnormality.No evidence of biliary obstruction or stone. Pancreas: Unremarkable. Spleen: Unremarkable. Adrenals/Urinary Tract: Negative adrenals. No hydronephrosis or stone. Unremarkable bladder. Stomach/Bowel:  No obstruction. No appendicitis. Vascular/Lymphatic: No acute vascular abnormality. No mass or adenopathy. Reproductive:Large uterus in keeping with recent delivery. Some high-density material is in the lower uterine segment compatible with blood clot, expected. No endometrial gas. High-density thickening at the level of the abdominal wall incision with the hemorrhage indistinguishable from the bilateral lower rectus abdominus, larger on the right with a 8 cm craniocaudal extent and approximately 4 cm anterior to posterior thickness. There is patchy subcutaneous stranding in the right more than left subcutaneous space, the presumed site of leakage. Sterility is indeterminate, aside from the hematoma is no organized collection. A transverse band of high-density is attributed to suture material as it follows the deep fascial air. No evidence of active hemorrhage. Other: No ascites or pneumoperitoneum. Musculoskeletal: No acute abnormalities. IMPRESSION: Recent cesarean section with hematomas in the bilateral rectus sheath. There is some liquefaction of the hematomas with abdominal wall stranding, the presumed cause of wound leakage. Electronically Signed   By: Marnee Spring M.D.   On: 02/25/2019 04:44    ____________________________________________   PROCEDURES   Procedure(s) performed (including Critical Care):  .Critical Care Performed by: Loleta Rose, MD Authorized by: Loleta Rose, MD   Critical care provider statement:    Critical care time (minutes):  30   Critical care time was exclusive of:  Separately billable procedures and treating other  patients   Critical care was necessary to treat or prevent imminent or life-threatening deterioration of the following conditions:  Sepsis   Critical care was time spent personally by me on the following activities:  Development of treatment plan with patient or surrogate, discussions with consultants, evaluation of patient's response to treatment, examination of patient, obtaining history from patient or surrogate, ordering and performing treatments and interventions, ordering and review of laboratory studies, ordering and review of radiographic studies, pulse oximetry, re-evaluation of patient's condition and review of old charts     ____________________________________________   INITIAL IMPRESSION / MDM / ASSESSMENT AND PLAN / ED COURSE  As part of my medical decision making, I reviewed the following data within  the electronic MEDICAL RECORD NUMBER Nursing notes reviewed and incorporated, Labs reviewed , Old chart reviewed, Discussed with admitting physician , Discussed with radiologist and Notes from prior ED visits   Differential diagnosis includes, but is not limited to, postoperative infection including abdominal abscess, bowel perforation, endometritis.  The patient's presentation is most consistent with what is likely an abdominal wall abscess in the setting of the recent surgery but bowel perforation is not yet ruled out.  She is tachycardic and has been febrile with a leukocytosis of 15 and meets septic criteria.  I have ordered code sepsis orders on her and I am treating with 1 L of normal saline for her tachycardia and empiric antibiotics including ceftriaxone 2 g IV, metronidazole 500 mg IV, and vancomycin per pharmacy consult.  This should be adequate coverage in the event of a bowel perforation as well as more of a cutaneous abscess which could also be due to MRSA.  I personally obtained a culture specimen from the purulence draining from the wound.  Of note, the patient's lactic acid was  within normal limits and she does not meet criteria for severe sepsis.  Given the probability that she may need to go back to the operating room for a postoperative abscess or wound infection, I am sending a rapid COVID-19 swab to prepare for the possibility of urgent/emergent surgery.  I am also giving morphine 4 mg IV and Zofran 4 mg IV for symptom control.      Clinical Course as of Feb 25 611  Fri Feb 25, 2019  1610 Lactic Acid, Venous: 1.2 [CF]  0311 Generally reassuring comprehensive metabolic panel  Comprehensive metabolic panel(!) [CF]  9604 Leukocytosis of 15, unclear significance in the setting of recent C-section.  Hemoglobin is 7.6 but this is actually an improvement from her hemoglobin of 6.9 a week ago as indicated in care everywhere from [facility].  CBC with Differential(!) [CF]  0449 SARS Coronavirus 2 by RT PCR: NEGATIVE [CF]  0458 Confirmed by phone with the radiologist that the findings are consistent with the clinical presentation of acute wound infection.  He pointed out that in addition to the large area of what appears to be infected hematoma, it extends below the deep fascia therefore is a deep abdominal space infection.  I updated the patient with the results.  We talked about the plan and her preference is to return to [facility] where she had the surgery which I feel is appropriate given that she is only 8 days out after the C-section.  I have asked my Diplomatic Services operational officer to call [facility] transfer center to discuss.  CT Abdomen Pelvis W Contrast [CF]  5409 Discussed case by phone with Dr. Ancil Boozer with [facility].  She agreed to accept the patient.  [facility] is working on appropriate bed placement and will call back with a bed assignment.  Patient stable for transfer.   [CF]  [phone] I received word via my secretary, Olegario Messier, that the patient is being transferred to the [facility] emergency department because of the lack of appropriate inpatient bed in the hospital.  The name of the accepting physician I was  given is Dr. Leatrice Jewels, and I have updated the EMTALA documentation.    [CF]    Clinical Course User Index [CF] Loleta Rose, MD     ____________________________________________  FINAL CLINICAL IMPRESSION(S) / ED DIAGNOSES  Final diagnoses:  Sepsis, due to unspecified organism, unspecified whether acute organ dysfunction present [facility])  Abdominal infection (HCC)  Postoperative  wound infection  Wound dehiscence     MEDICATIONS GIVEN DURING THIS VISIT:  Medications  vancomycin (VANCOCIN) IVPB 750 mg/150 ml premix (has no administration in time range)  cefTRIAXone (ROCEPHIN) 2 g in sodium chloride 0.9 % 100 mL IVPB (0 g Intravenous Stopped 02/25/19 0424)  metroNIDAZOLE (FLAGYL) IVPB 500 mg (0 mg Intravenous Stopped 02/25/19 0552)  vancomycin (VANCOCIN) IVPB 1000 mg/200 mL premix (0 mg Intravenous Stopped 02/25/19 0551)  sodium chloride 0.9 % bolus 1,000 mL (0 mLs Intravenous Stopped 02/25/19 0525)  morphine 4 MG/ML injection 4 mg (4 mg Intravenous Given 02/25/19 0340)  ondansetron (ZOFRAN) injection 4 mg (4 mg Intravenous Given 02/25/19 0340)  iohexol (OMNIPAQUE) 300 MG/ML solution 100 mL (100 mLs Intravenous Contrast Given 02/25/19 [id number])     ED Discharge Orders    None      *Please note:  Jochebed Everlean Cherryina Gutierrez was evaluated in Emergency Department on 02/25/2019 for the symptoms described in the history of present illness. She was evaluated in the context of the global COVID-19 pandemic, which necessitated consideration that the patient might be at risk for infection with the SARS-CoV-2 virus that causes COVID-19. Institutional protocols and algorithms that pertain to the evaluation of patients at risk for COVID-19 are in a state of rapid change based on information released by regulatory bodies including the CDC and federal and state organizations. These policies and algorithms were followed during the patient's care in the ED.  Some ED evaluations and interventions may  be delayed as a result of limited staffing during the pandemic.*  Note:  This document was prepared using Dragon voice recognition software and may include unintentional dictation errors.   Loleta RoseForbach, Simran Mannis, MD 02/25/19 [phone]

## 2019-02-25 NOTE — Progress Notes (Signed)
CODE SEPSIS - PHARMACY COMMUNICATION  **Broad Spectrum Antibiotics should be administered within 1 hour of Sepsis diagnosis**  Time Code Sepsis Called/Page Received: 0349  Antibiotics Ordered: Rocephin  Time of 1st antibiotic administration: 0345  Additional action taken by pharmacy: n/a   If necessary, Name of Provider/Nurse Contacted: n/a    Shari Spears ,PharmD Clinical Pharmacist  02/25/2019  3:32 AM

## 2019-02-28 LAB — URINE CULTURE: Special Requests: NORMAL

## 2019-02-28 LAB — AEROBIC/ANAEROBIC CULTURE W GRAM STAIN (SURGICAL/DEEP WOUND): Special Requests: NORMAL

## 2019-03-02 LAB — CULTURE, BLOOD (ROUTINE X 2)
Culture: NO GROWTH
Culture: NO GROWTH
Special Requests: ADEQUATE
Special Requests: ADEQUATE

## 2019-07-27 ENCOUNTER — Emergency Department: Payer: Medicaid Other

## 2019-07-27 ENCOUNTER — Other Ambulatory Visit: Payer: Self-pay

## 2019-07-27 ENCOUNTER — Emergency Department
Admission: EM | Admit: 2019-07-27 | Discharge: 2019-07-28 | Disposition: A | Discharge status: Home / Self Care | Payer: Medicaid Other | Attending: Emergency Medicine | Admitting: Emergency Medicine

## 2019-07-27 ENCOUNTER — Encounter: Payer: Self-pay | Admitting: Emergency Medicine

## 2019-07-27 DIAGNOSIS — R1011 Right upper quadrant pain: Secondary | ICD-10-CM | POA: Diagnosis present

## 2019-07-27 DIAGNOSIS — R7401 Elevation of levels of liver transaminase levels: Secondary | ICD-10-CM | POA: Diagnosis not present

## 2019-07-27 DIAGNOSIS — Z20822 Contact with and (suspected) exposure to covid-19: Secondary | ICD-10-CM | POA: Diagnosis not present

## 2019-07-27 DIAGNOSIS — E119 Type 2 diabetes mellitus without complications: Secondary | ICD-10-CM | POA: Insufficient documentation

## 2019-07-27 DIAGNOSIS — I10 Essential (primary) hypertension: Secondary | ICD-10-CM | POA: Insufficient documentation

## 2019-07-27 LAB — CBC
HCT: 34.7 % — ABNORMAL LOW (ref 36.0–46.0)
Hemoglobin: 10.5 g/dL — ABNORMAL LOW (ref 12.0–15.0)
MCH: 20.6 pg — ABNORMAL LOW (ref 26.0–34.0)
MCHC: 30.3 g/dL (ref 30.0–36.0)
MCV: 68 fL — ABNORMAL LOW (ref 80.0–100.0)
Platelets: 374 10*3/uL (ref 150–400)
RBC: 5.1 MIL/uL (ref 3.87–5.11)
RDW: 16.4 % — ABNORMAL HIGH (ref 11.5–15.5)
WBC: 12.5 10*3/uL — ABNORMAL HIGH (ref 4.0–10.5)
nRBC: 0 % (ref 0.0–0.2)

## 2019-07-27 LAB — URINALYSIS, COMPLETE (UACMP) WITH MICROSCOPIC
Bacteria, UA: NONE SEEN
Bilirubin Urine: NEGATIVE
Glucose, UA: >=500 mg/dL — AB
Hgb urine dipstick: NEGATIVE
Ketones, ur: 5 mg/dL — AB
Leukocytes,Ua: NEGATIVE
Nitrite: NEGATIVE
Protein, ur: 100 mg/dL — AB
Specific Gravity, Urine: 1.043 — ABNORMAL HIGH (ref 1.005–1.030)
pH: 5 (ref 5.0–8.0)

## 2019-07-27 LAB — COMPREHENSIVE METABOLIC PANEL
ALT: 167 U/L — ABNORMAL HIGH (ref 0–44)
AST: 179 U/L — ABNORMAL HIGH (ref 15–41)
Albumin: 4 g/dL (ref 3.5–5.0)
Alkaline Phosphatase: 154 U/L — ABNORMAL HIGH (ref 38–126)
Anion gap: 10 (ref 5–15)
BUN: 13 mg/dL (ref 6–20)
CO2: 23 mmol/L (ref 22–32)
Calcium: 8.2 mg/dL — ABNORMAL LOW (ref 8.9–10.3)
Chloride: 103 mmol/L (ref 98–111)
Creatinine, Ser: 0.44 mg/dL (ref 0.44–1.00)
GFR calc Af Amer: >60 mL/min (ref >60)
GFR calc non Af Amer: >60 mL/min (ref >60)
Glucose, Bld: 322 mg/dL — ABNORMAL HIGH (ref 70–99)
Potassium: 3.8 mmol/L (ref 3.5–5.1)
Sodium: 136 mmol/L (ref 135–145)
Total Bilirubin: 0.6 mg/dL (ref 0.3–1.2)
Total Protein: 8.4 g/dL — ABNORMAL HIGH (ref 6.5–8.1)

## 2019-07-27 LAB — TROPONIN I (HIGH SENSITIVITY): Troponin I (High Sensitivity): <2 ng/L (ref <18)

## 2019-07-27 LAB — POCT PREGNANCY, URINE: Preg Test, Ur: NEGATIVE

## 2019-07-27 LAB — LIPASE, BLOOD: Lipase: 32 U/L (ref 11–51)

## 2019-07-27 IMAGING — US US ABDOMEN LIMITED
1 series · 14 of 25 positions shown · non-contrast
Comparison: None.

CLINICAL DATA: Right upper quadrant pain for several hours

EXAM:
ULTRASOUND ABDOMEN LIMITED RIGHT UPPER QUADRANT

[Series 1: us abdomen limited ruq · 14 of 49 slices shown]
[im 1/49]
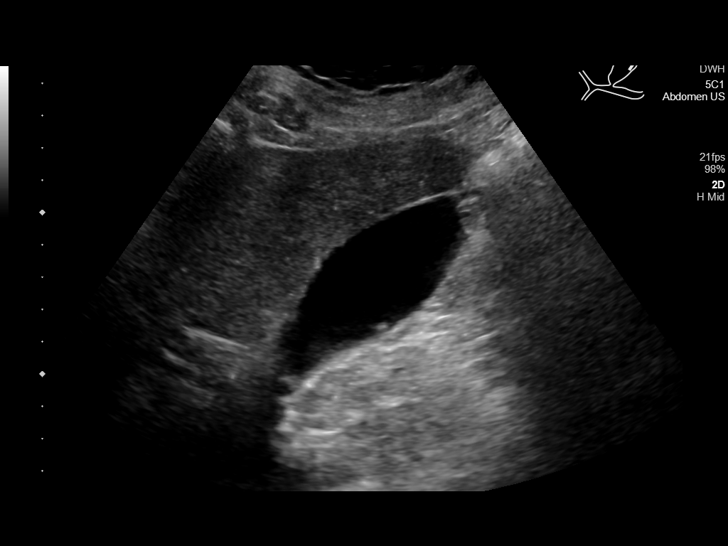
[im 5/49]
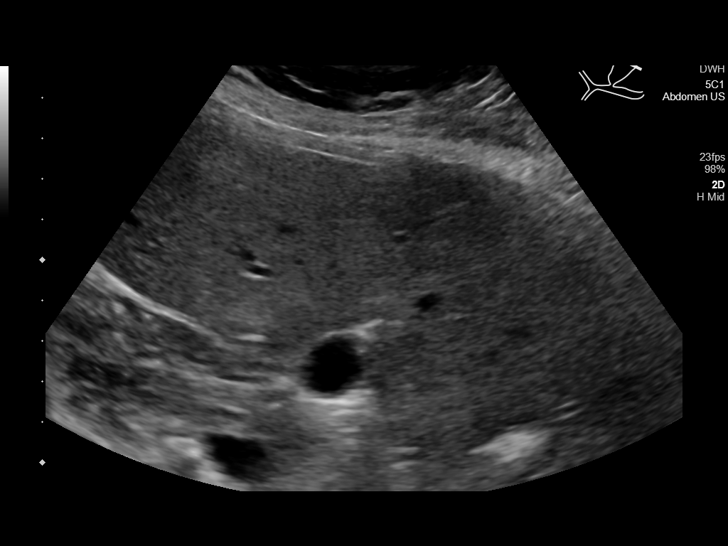
[im 9/49]
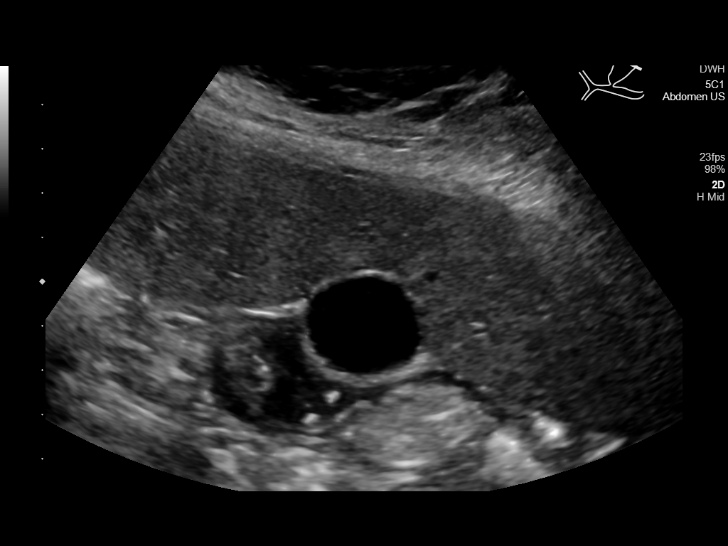
[im 13/49]
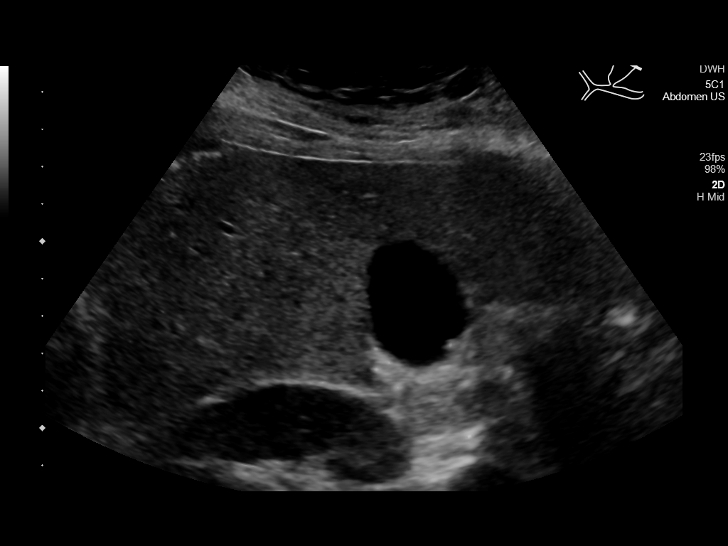
[im 17/49]
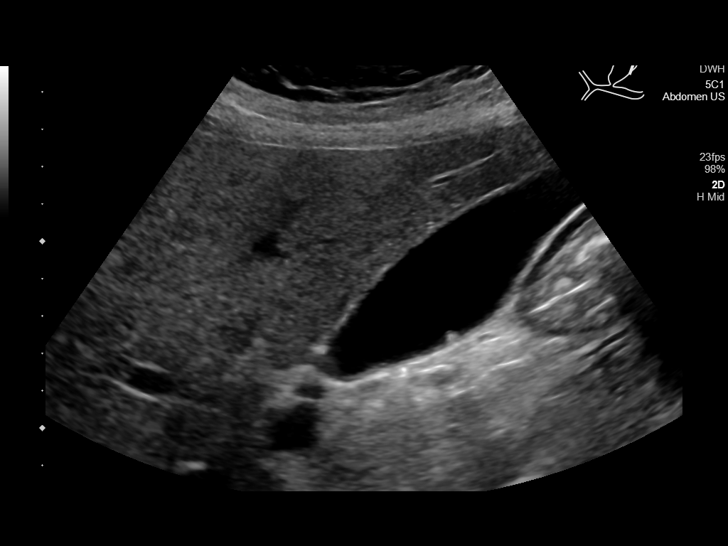
[im 19/49]
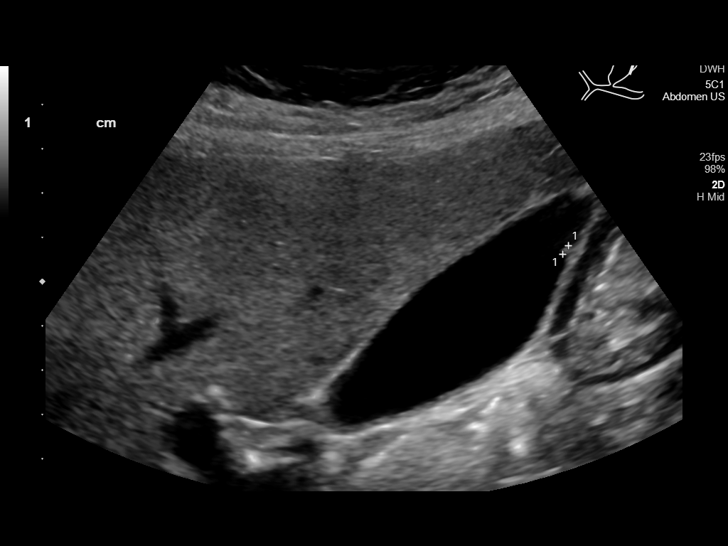
[im 23/49]
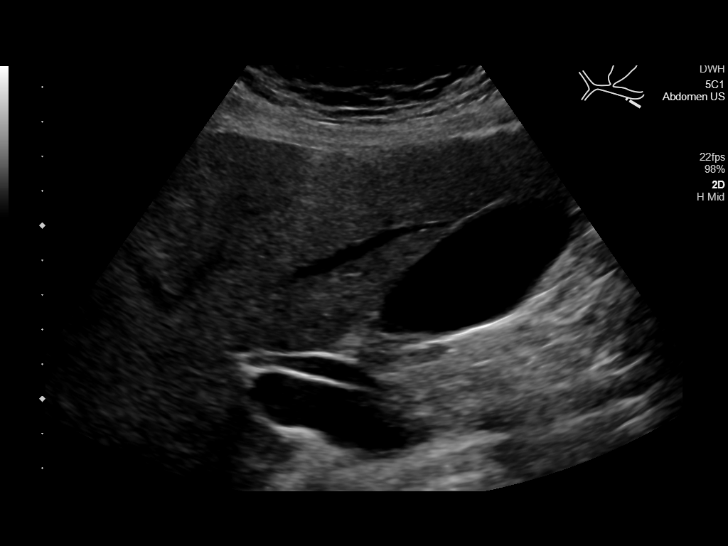
[im 27/49]
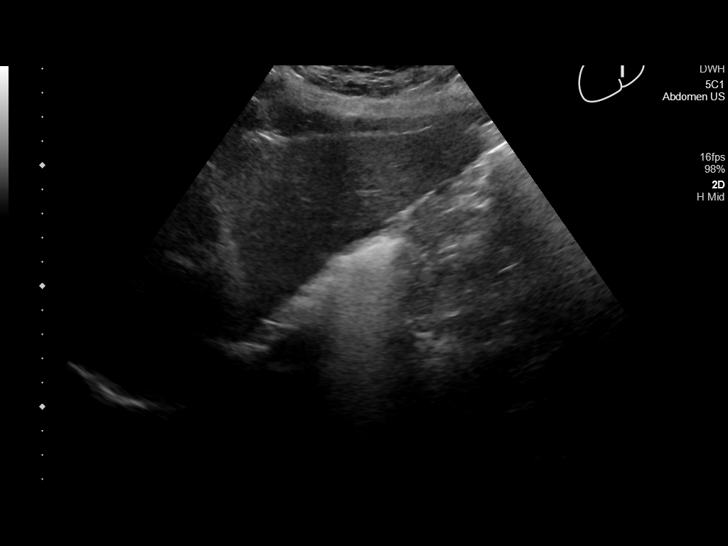
[im 31/49]
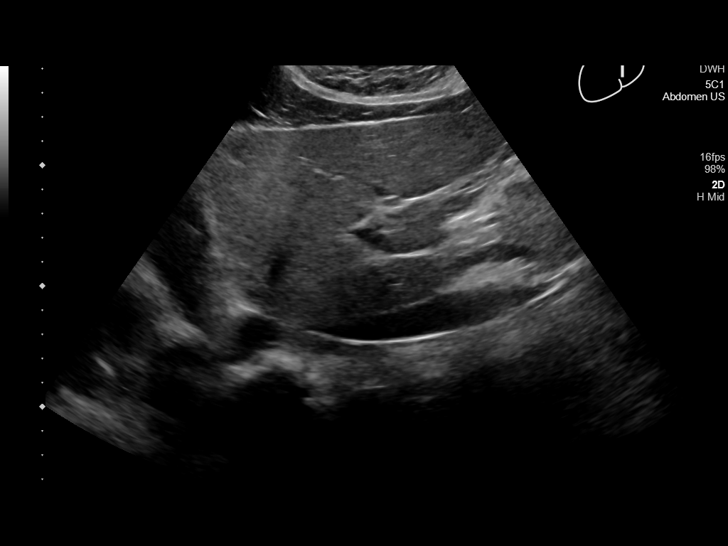
[im 33/49]
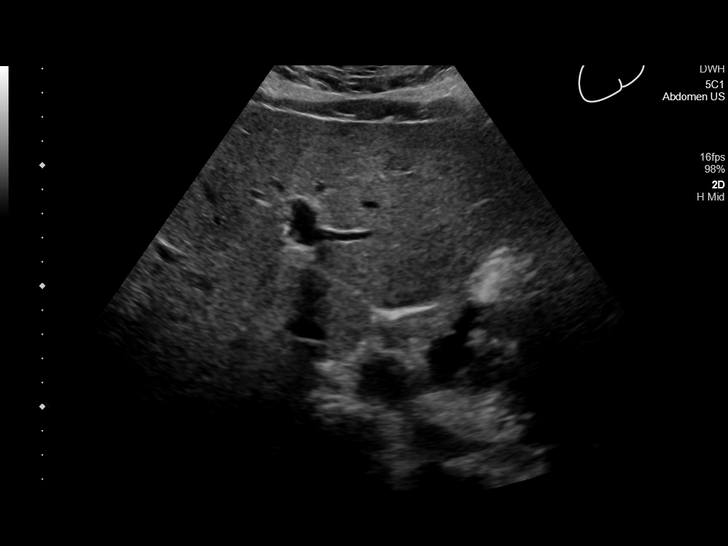
[im 37/49]
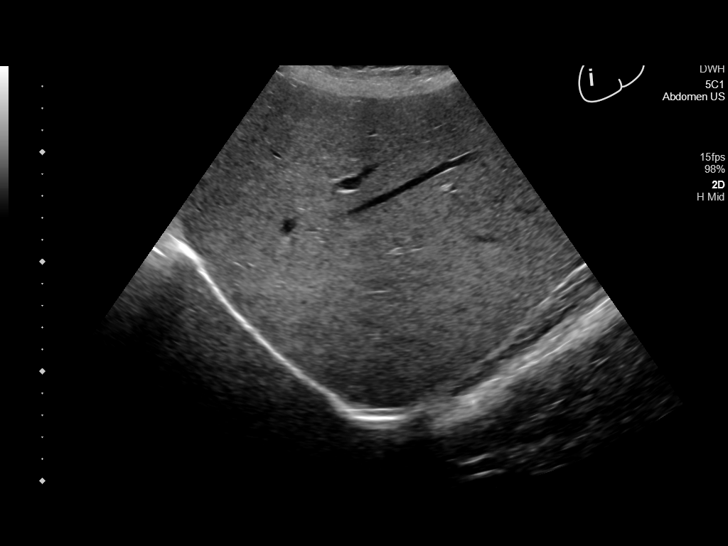
[im 41/49]
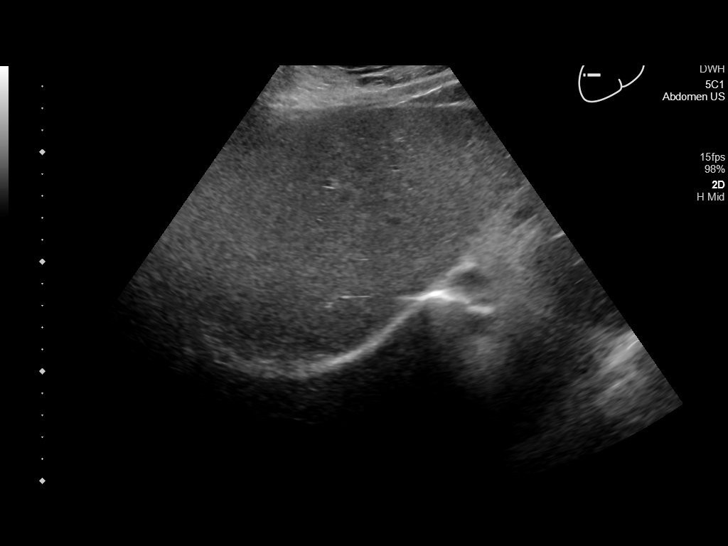
[im 45/49]
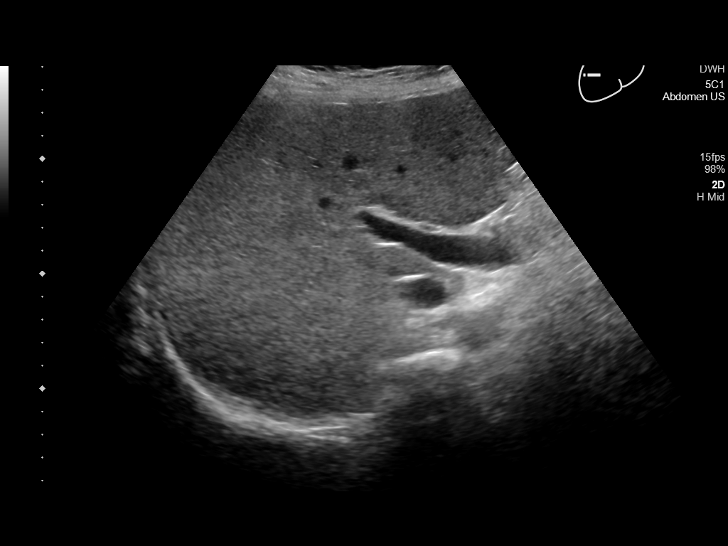
[im 49/49]
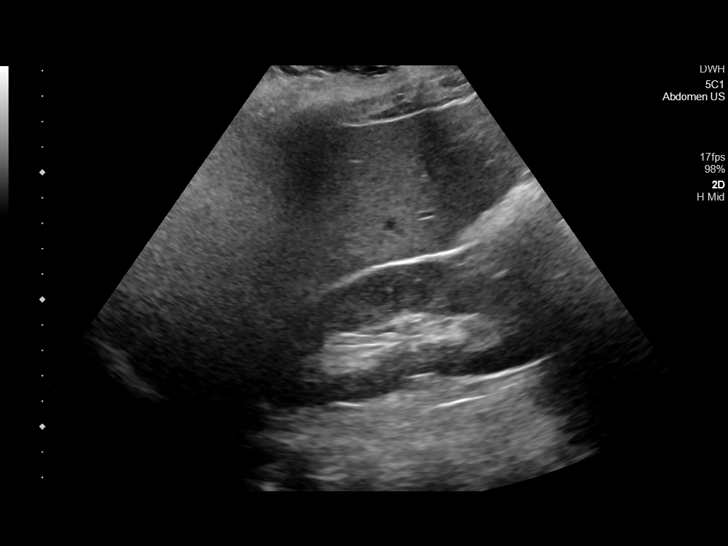

[14 of 25 positions shown; findings below may reference images not displayed]

FINDINGS: Gallbladder:

Gallbladder is well distended. Non mobile nonshadowing
echogenicities are noted consistent with small gallbladder polyps.
No wall thickening or pericholecystic fluid is noted.

Common bile duct:

Diameter: 6 mm.

Liver:

No focal lesion identified. Within normal limits in parenchymal
echogenicity. Portal vein is patent on color Doppler imaging with
normal direction of blood flow towards the liver.

Other: None.
IMPRESSION: Small gallbladder polyps.  No complicating factors are seen.

## 2019-07-27 NOTE — ED Triage Notes (Signed)
Pt presents to ED with sudden onset of dizziness, nausea and vomiting, and epigastric abd pain since just after lunch. Pt reports approx 15 min ago symptoms worsened. Pt states she has vomited every 30 min; last time 10 min ago.

## 2019-07-28 ENCOUNTER — Emergency Department: Payer: Medicaid Other

## 2019-07-28 LAB — COMPREHENSIVE METABOLIC PANEL
ALT: 170 U/L — ABNORMAL HIGH (ref 0–44)
AST: 186 U/L — ABNORMAL HIGH (ref 15–41)
Albumin: 3.5 g/dL (ref 3.5–5.0)
Alkaline Phosphatase: 122 U/L (ref 38–126)
Anion gap: 5 (ref 5–15)
BUN: 11 mg/dL (ref 6–20)
CO2: 24 mmol/L (ref 22–32)
Calcium: 7.4 mg/dL — ABNORMAL LOW (ref 8.9–10.3)
Chloride: 105 mmol/L (ref 98–111)
Creatinine, Ser: 0.4 mg/dL — ABNORMAL LOW (ref 0.44–1.00)
GFR calc Af Amer: >60 mL/min (ref >60)
GFR calc non Af Amer: >60 mL/min (ref >60)
Glucose, Bld: 254 mg/dL — ABNORMAL HIGH (ref 70–99)
Potassium: 3.7 mmol/L (ref 3.5–5.1)
Sodium: 134 mmol/L — ABNORMAL LOW (ref 135–145)
Total Bilirubin: 0.8 mg/dL (ref 0.3–1.2)
Total Protein: 7.1 g/dL (ref 6.5–8.1)

## 2019-07-28 LAB — SARS CORONAVIRUS 2 BY RT PCR (HOSPITAL ORDER, PERFORMED IN ~~LOC~~ HOSPITAL LAB): SARS Coronavirus 2: NEGATIVE

## 2019-07-28 LAB — HEPATITIS PANEL, ACUTE
HCV Ab: NONREACTIVE
Hep A IgM: NONREACTIVE
Hep B C IgM: NONREACTIVE
Hepatitis B Surface Ag: NONREACTIVE

## 2019-07-28 IMAGING — CT CT ABD-PELV W/ CM
2 of 5 series · 16 of 46 positions shown, 18 images · IV contrast (APPLIED)
Comparison: None.

CLINICAL DATA: Abdominal pain, dizziness and nausea

EXAM:
CT ABDOMEN AND PELVIS WITH CONTRAST
TECHNIQUE: Multidetector CT imaging of the abdomen and pelvis was performed
using the standard protocol following bolus administration of
intravenous contrast.
CONTRAST:  100mL OMNIPAQUE IOHEXOL 300 MG/ML  SOLN

[Series 2: routine abd/pel with · axial · 0.83mm/px · z∈[-1239,-834]mm · 13 of 91 slices shown, 15 images]
[im 5/91  soft-tissue]
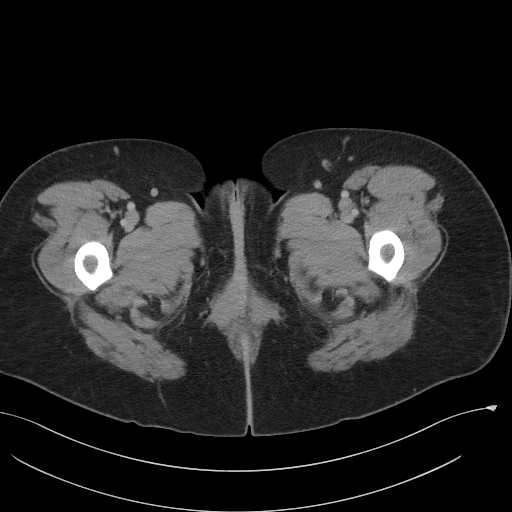
[im 5/91  bone]
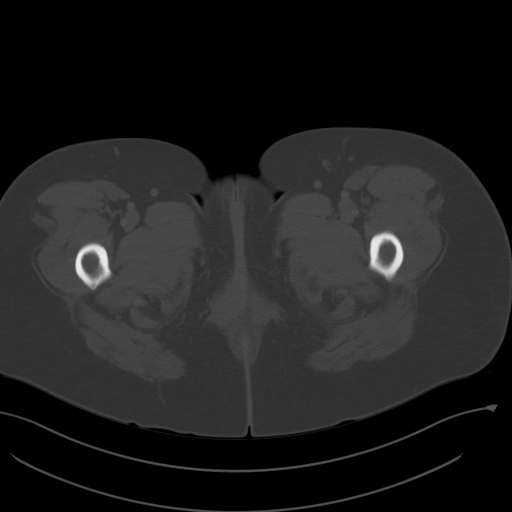
[im 15/91  soft-tissue]
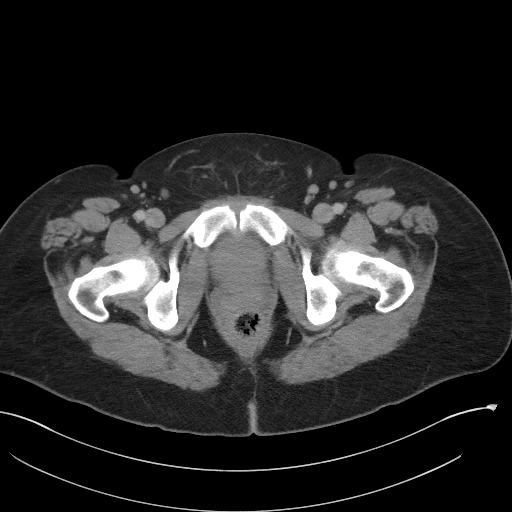
[im 19/91  soft-tissue]
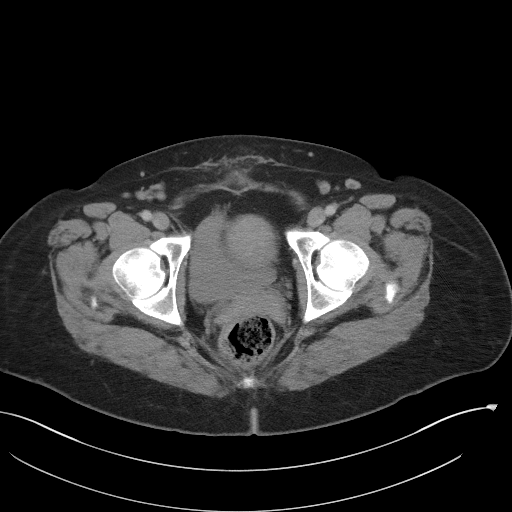
[im 24/91  soft-tissue]
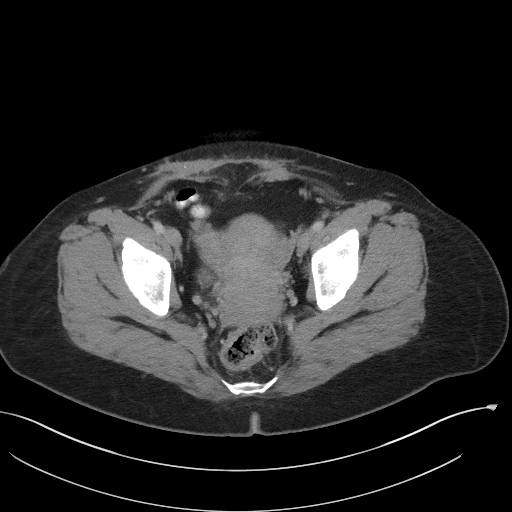
[im 34/91  soft-tissue]
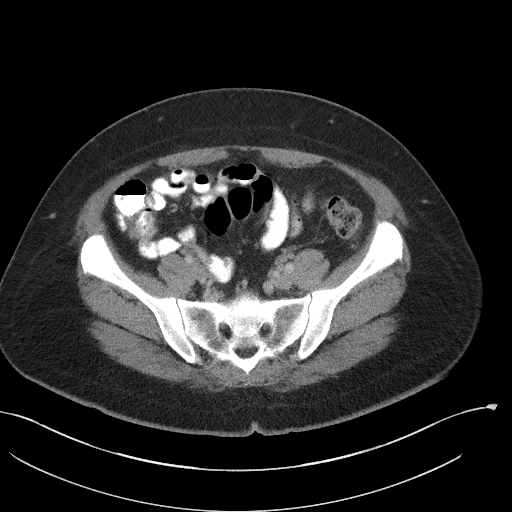
[im 38/91  soft-tissue]
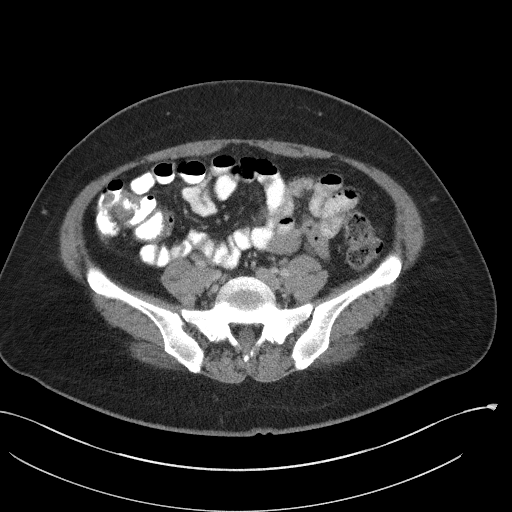
[im 48/91  soft-tissue]
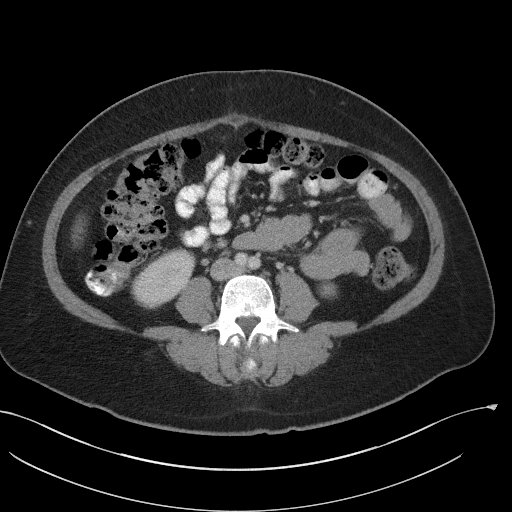
[im 53/91  soft-tissue]
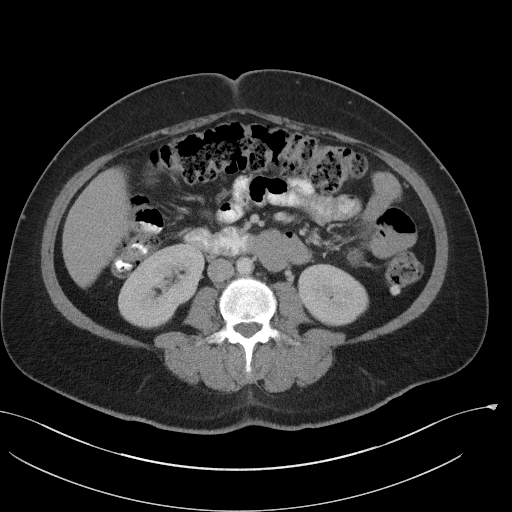
[im 57/91  soft-tissue]
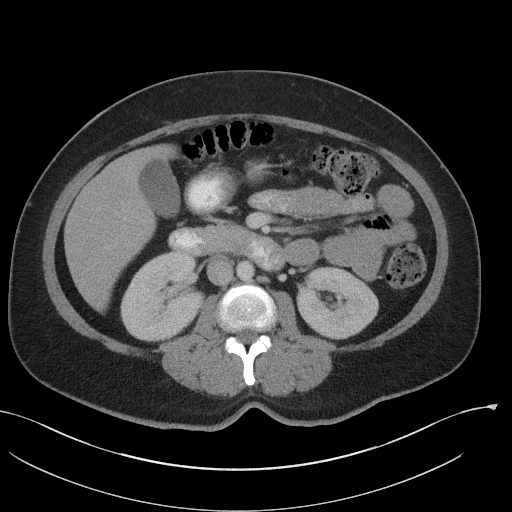
[im 57/91  bone]
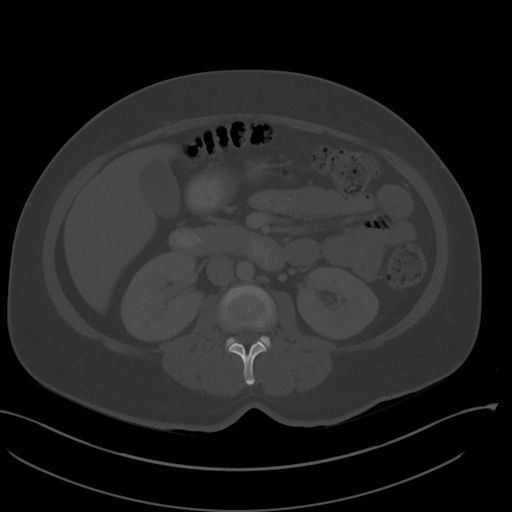
[im 67/91  soft-tissue]
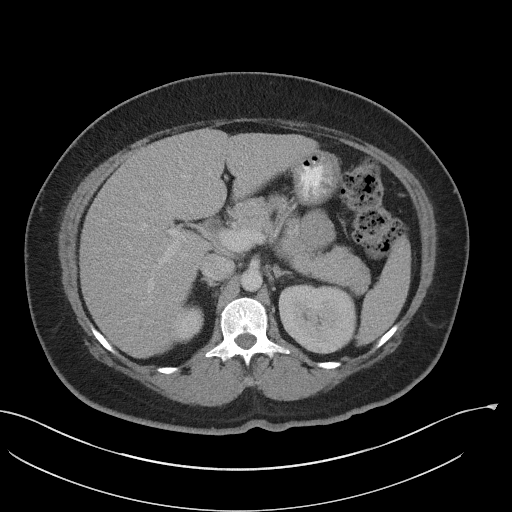
[im 72/91  soft-tissue]
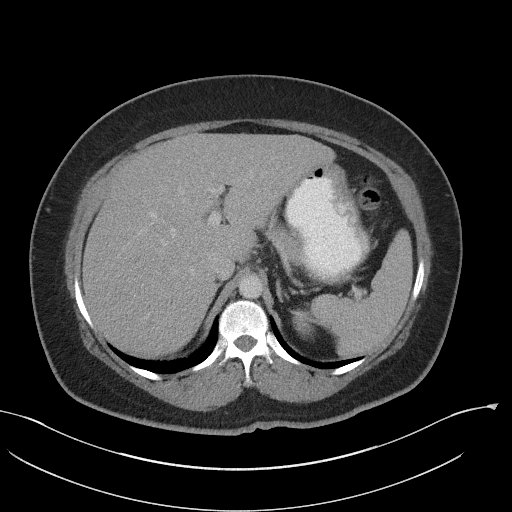
[im 76/91  soft-tissue]
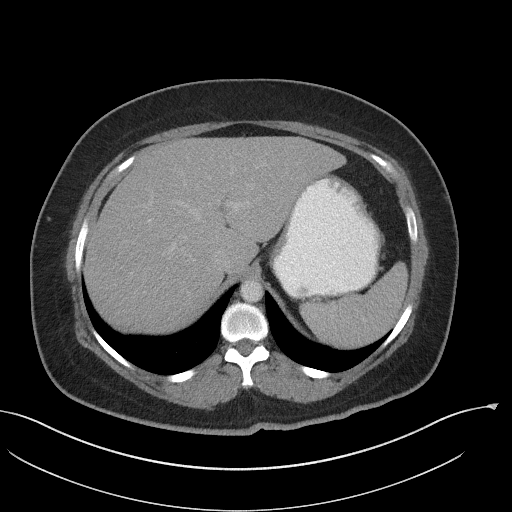
[im 86/91  soft-tissue]
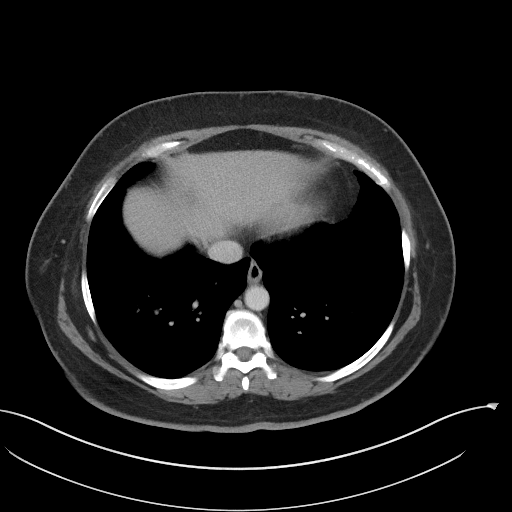

[Series 5: coronal st · coronal · 0.79mm/px · 3 of 90 slices shown]
[im 30/90  soft-tissue]
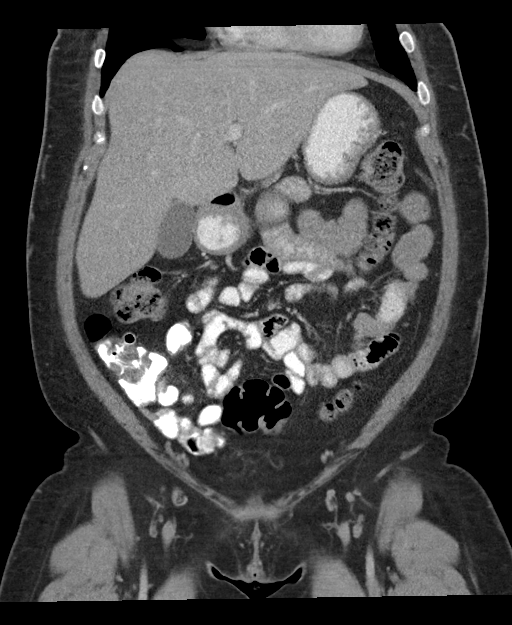
[im 40/90  soft-tissue]
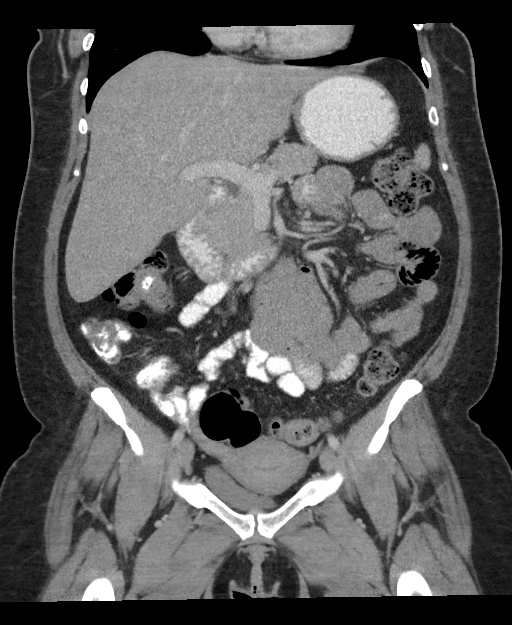
[im 50/90  soft-tissue]
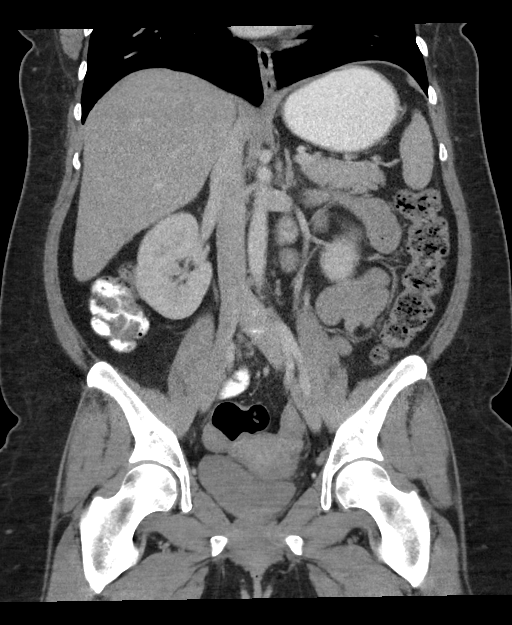

[16 of 46 positions shown; findings below may reference images not displayed]

FINDINGS: Lower chest: The visualized heart size within normal limits. No
pericardial fluid/thickening.

No hiatal hernia.

The visualized portions of the lungs are clear.

Hepatobiliary: There is diffuse low density seen throughout the
liver parenchyma.The main portal vein is patent. No evidence of
calcified gallstones, gallbladder wall thickening or biliary
dilatation.

Pancreas: Unremarkable. No pancreatic ductal dilatation or
surrounding inflammatory changes.

Spleen: Normal in size without focal abnormality.

Adrenals/Urinary Tract: Both adrenal glands appear normal. The
kidneys and collecting system appear normal without evidence of
urinary tract calculus or hydronephrosis. Bladder is unremarkable.

Stomach/Bowel: The stomach, small bowel, and colon are normal in
appearance. No inflammatory changes, wall thickening, or obstructive
findings.The appendix is normal.

Vascular/Lymphatic: There are no enlarged mesenteric,
retroperitoneal, or pelvic lymph nodes. No significant vascular
findings are present.

Reproductive: The uterus and adnexa are unremarkable.

Other: Along the lower anterior abdominal wall there is a focal area
of diastasis with a fat containing hernia. Overlying surgical scar
seen.

Musculoskeletal: No acute or significant osseous findings.
IMPRESSION: No acute intra-abdominal or pelvic pathology to explain the
patient's symptoms.

Hepatic steatosis

## 2019-07-28 MED ORDER — IOHEXOL 9 MG/ML PO SOLN
500.0000 mL | ORAL | Status: AC
  Administered 2019-07-28 (×2): 500 mL via ORAL

## 2019-07-28 MED ORDER — ONDANSETRON 4 MG PO TBDP: 4.0000 mg | ORAL_TABLET | Freq: Three times a day (TID) | ORAL | 0 refills | Status: DC | PRN

## 2019-07-28 MED ORDER — ONDANSETRON HCL 4 MG/2ML IJ SOLN
4.0000 mg | Freq: Once | INTRAMUSCULAR | Status: AC
  Administered 2019-07-28: 4 mg via INTRAVENOUS
  Filled 2019-07-28: qty 2

## 2019-07-28 MED ORDER — IOHEXOL 300 MG/ML  SOLN
100.0000 mL | Freq: Once | INTRAMUSCULAR | Status: AC | PRN
  Administered 2019-07-28: 100 mL via INTRAVENOUS

## 2019-07-28 MED ORDER — MORPHINE SULFATE (PF) 2 MG/ML IV SOLN
2.0000 mg | Freq: Once | INTRAVENOUS | Status: AC
  Administered 2019-07-28: 2 mg via INTRAVENOUS
  Filled 2019-07-28: qty 1

## 2019-07-28 MED ORDER — SODIUM CHLORIDE 0.9 % IV BOLUS
1000.0000 mL | Freq: Once | INTRAVENOUS | Status: AC
  Administered 2019-07-28: 1000 mL via INTRAVENOUS

## 2019-07-28 NOTE — ED Provider Notes (Signed)
[facility] Emergency Department Provider Note  ____________________________________________   First MD Initiated Contact with Patient 07/28/19 0014     (approximate)  I have reviewed the triage vital signs and the nursing notes.   HISTORY  Chief Complaint Dizziness, Emesis, and Abdominal Pain    HPI Shari Spears is a 44 y.o. female with below list of previous medical conditions including hypertension diabetes mellitus type 2 and colitis presents to the emergency department secondary to acute onset of nausea and vomiting this morning after eating lunch.  Patient also admits to right-sided abdominal pain.  Patient states that symptoms worsened approximately 15 minutes before arrival to the emergency department.  Patient states last episode of vomiting was 10 minutes before presenting to triage.  On arrival to the emergency patient noted to be febrile with a temperature of 100.5.    Past Medical History:  Diagnosis Date  . Colitis   . Diabetes mellitus without complication ([place])   . Hypertension     There are no problems to display for this patient.   Past Surgical History:  Procedure Laterality Date  . CESAREAN SECTION    . HERNIA REPAIR      Prior to Admission medications   Medication Sig Start Date End Date Taking? Authorizing Provider  famotidine (PEPCID) 20 MG tablet Take 1 tablet (20 mg total) by mouth 2 (two) times daily. 09/11/16   Earleen Newport, MD  ondansetron (ZOFRAN ODT) 4 MG disintegrating tablet Take 1 tablet (4 mg total) by mouth every 8 (eight) hours as needed for nausea or vomiting. 09/11/16   Earleen Newport, MD  ondansetron (ZOFRAN ODT) 4 MG disintegrating tablet Take 1 tablet (4 mg total) by mouth every 8 (eight) hours as needed. 07/28/19   Gregor Hams, MD  oxycodone (OXY-IR) 5 MG capsule Take 1 capsule (5 mg total) by mouth every 4 (four) hours as needed. 09/11/16   Earleen Newport, MD    pantoprazole (PROTONIX) 40 MG tablet Take 1 tablet (40 mg total) by mouth daily. 09/11/16 09/11/17  Earleen Newport, MD  promethazine (PHENERGAN) 25 MG tablet Take 1 tablet (25 mg total) by mouth every 6 (six) hours as needed for nausea or vomiting. 09/11/16   Earleen Newport, MD  sucralfate (CARAFATE) 1 g tablet Take 1 tablet (1 g total) by mouth 4 (four) times daily. 09/11/16 09/11/17  Earleen Newport, MD    Allergies Patient has no known allergies.  No family history on file.  Social History Social History   Tobacco Use  . Smoking status: Never Smoker  . Smokeless tobacco: Never Used  Substance Use Topics  . Alcohol use: No  . Drug use: No    Review of Systems Constitutional: No fever/chills Eyes: No visual changes. ENT: No sore throat. Cardiovascular: Denies chest pain. Respiratory: Denies shortness of breath. Gastrointestinal: Positive for abdominal pain nausea and vomiting Genitourinary: Negative for dysuria. Musculoskeletal: Negative for neck pain.  Negative for back pain. Integumentary: Negative for rash. Neurological: Negative for headaches, focal weakness or numbness.  ____________________________________________   PHYSICAL EXAM:  VITAL SIGNS: ED Triage Vitals  Enc Vitals Group     BP 07/27/19 2129 123/76     Pulse Rate 07/27/19 2129 (!) 108     Resp 07/27/19 2129 20     Temp 07/27/19 2129 (!) 100.5 F (38.1 C)     Temp Source 07/27/19 2129 Oral     SpO2 07/27/19 2129 98 %  Weight 07/27/19 2130 85.3 kg (188 lb)     Height --      Head Circumference --      Peak Flow --      Pain Score 07/27/19 2129 10     Pain Loc --      Pain Edu? --      Excl. in GC? --     Constitutional: Alert and oriented.  Eyes: Conjunctivae are normal.  Mouth/Throat: Patient is wearing a mask. Neck: No stridor.  No meningeal signs.   Cardiovascular: Normal rate, regular rhythm. Good peripheral circulation. Grossly normal heart sounds. Respiratory: Normal  respiratory effort.  No retractions. Gastrointestinal: Soft and nontender. No distention.  Musculoskeletal: No lower extremity tenderness nor edema. No gross deformities of extremities. Neurologic:  Normal speech and language. No gross focal neurologic deficits are appreciated.  Skin:  Skin is warm, dry and intact. Psychiatric: Mood and affect are normal. Speech and behavior are normal.  ____________________________________________   LABS (all labs ordered are listed, but only abnormal results are displayed)  Labs Reviewed  COMPREHENSIVE METABOLIC PANEL - Abnormal; Notable for the following components:      Result Value   Glucose, Bld 322 (*)    Calcium 8.2 (*)    Total Protein 8.4 (*)    AST 179 (*)    ALT 167 (*)    Alkaline Phosphatase 154 (*)    All other components within normal limits  CBC - Abnormal; Notable for the following components:   WBC 12.5 (*)    Hemoglobin 10.5 (*)    HCT 34.7 (*)    MCV 68.0 (*)    MCH 20.6 (*)    RDW 16.4 (*)    All other components within normal limits  URINALYSIS, COMPLETE (UACMP) WITH MICROSCOPIC - Abnormal; Notable for the following components:   Color, Urine YELLOW (*)    APPearance HAZY (*)    Specific Gravity, Urine 1.043 (*)    Glucose, UA >=500 (*)    Ketones, ur 5 (*)    Protein, ur 100 (*)    All other components within normal limits  COMPREHENSIVE METABOLIC PANEL - Abnormal; Notable for the following components:   Sodium 134 (*)    Glucose, Bld 254 (*)    Creatinine, Ser 0.40 (*)    Calcium 7.4 (*)    AST 186 (*)    ALT 170 (*)    All other components within normal limits  SARS CORONAVIRUS 2 BY RT PCR (HOSPITAL ORDER, PERFORMED IN [place] [facility])  LIPASE, BLOOD  HEPATITIS PANEL, ACUTE  POC URINE PREG, ED  POCT PREGNANCY, URINE  TROPONIN I (HIGH SENSITIVITY)   ____________________________________________  EKG  ED ECG REPORT I, [place] N Zelig Gacek, the attending physician, personally viewed and interpreted  this ECG.   Date: 07/28/2019  EKG Time: 9:24PM  Rate: 108  Rhythm: Sinus Tachycardia  Axis: Normal  Intervals:Normal  ST&T Change: None  ____________________________________________  RADIOLOGY I, [place] Dewayne Shorter, personally viewed and evaluated these images (plain radiographs) as part of my medical decision making, as well as reviewing the written report by the radiologist.  ED MD interpretation:    Official radiology report(s): CT ABDOMEN PELVIS W CONTRAST  Result Date: 07/28/2019 CLINICAL DATA:  Abdominal pain, dizziness and nausea EXAM: CT ABDOMEN AND PELVIS WITH CONTRAST TECHNIQUE: Multidetector CT imaging of the abdomen and pelvis was performed using the standard protocol following bolus administration of intravenous contrast. CONTRAST:  OMNIPAQUE IOHEXOL 300 MG/ML  SOLN COMPARISON:  None. FINDINGS: Lower chest: The visualized heart size within normal limits. No pericardial fluid/thickening. No hiatal hernia. The visualized portions of the lungs are clear. Hepatobiliary: There is diffuse low density seen throughout the liver parenchyma.The main portal vein is patent. No evidence of calcified gallstones, gallbladder wall thickening or biliary dilatation. Pancreas: Unremarkable. No pancreatic ductal dilatation or surrounding inflammatory changes. Spleen: Normal in size without focal abnormality. Adrenals/Urinary Tract: Both adrenal glands appear normal. The kidneys and collecting system appear normal without evidence of urinary tract calculus or hydronephrosis. Bladder is unremarkable. Stomach/Bowel: The stomach, small bowel, and colon are normal in appearance. No inflammatory changes, wall thickening, or obstructive findings.The appendix is normal. Vascular/Lymphatic: There are no enlarged mesenteric, retroperitoneal, or pelvic lymph nodes. No significant vascular findings are present. Reproductive: The uterus and adnexa are unremarkable. Other: Along the lower anterior abdominal wall  there is a focal area of diastasis with a fat containing hernia. Overlying surgical scar seen. Musculoskeletal: No acute or significant osseous findings. IMPRESSION: No acute intra-abdominal or pelvic pathology to explain the patient's symptoms. Hepatic steatosis Electronically Signed   By: Jonna Clark M.D.   On: 07/28/2019 03:13   US ABDOMEN LIMITED RUQ  Result Date: 07/27/2019 CLINICAL DATA:  Right upper quadrant pain for several hours EXAM: ULTRASOUND ABDOMEN LIMITED RIGHT UPPER QUADRANT COMPARISON:  None. FINDINGS: Gallbladder: Gallbladder is well distended. Non mobile nonshadowing echogenicities are noted consistent with small gallbladder polyps. No wall thickening or pericholecystic fluid is noted. Common bile duct: Diameter: 6 mm. Liver: No focal lesion identified. Within normal limits in parenchymal echogenicity. Portal vein is patent on color Doppler imaging with normal direction of blood flow towards the liver. Other: None. IMPRESSION: Small gallbladder polyps.  No complicating factors are seen. Electronically Signed   By: Alcide Clever M.D.   On: 07/27/2019 23:34      Procedures   ____________________________________________   INITIAL IMPRESSION / MDM / ASSESSMENT AND PLAN / ED COURSE  As part of my medical decision making, I reviewed the following data within the electronic MEDICAL RECORD NUMBER   44 year old female presented with above-stated history and physical exam secondary to abdominal pain with differential diagnosis including but not limited to cholelithiasis cholecystitis pancreatitis gastroenteritis as such appropriate laboratory data obtained which revealed transaminitis.  Review of the patient's chart from [facility] revealed that the patient's liver enzymes have been steadily increasing since January of this year.  Ultrasound of the abdomen revealed a gallbladder polyp however no stone or evidence of cholecystitis.  CT scan of the abdomen pelvis did reveal hepatic steatosis however no  other findings.  Patient received Zofran 4 mg morphine 2 mg immediately after my evaluation with improvement of pain and no vomiting during the entire time the patient was under my care in the emergency department.  Spoke with the patient at length regarding all clinical findings and the necessity of following up with gastroenterology regarding her liver enzymes which he agreed to do so.  Patient referred to Dr. Allegra Lai gastroenterologist. ____________________________________________  FINAL CLINICAL IMPRESSION(S) / ED DIAGNOSES  Final diagnoses:  RUQ pain  Transaminitis     MEDICATIONS GIVEN DURING THIS VISIT:  Medications  sodium chloride 0.9 % bolus 1,000 mL (0 mLs Intravenous Stopped 07/28/19 0239)  iohexol (OMNIPAQUE) 9 MG/ML oral solution 500 mL (500 mLs Oral Contrast Given 07/28/19 0037)  morphine 2 MG/ML injection 2 mg (2 mg Intravenous Given 07/28/19 0050)  ondansetron (ZOFRAN) injection 4 mg (4 mg Intravenous Given 07/28/19  6815)  iohexol (OMNIPAQUE) 300 MG/ML solution 100 mL (100 mLs Intravenous Contrast Given 07/28/19 0254)     ED Discharge Orders         Ordered    ondansetron (ZOFRAN ODT) 4 MG disintegrating tablet  Every 8 hours PRN     07/28/19 9470          *Please note:  Kasheena Sambrano was evaluated in Emergency Department on 07/28/2019 for the symptoms described in the history of present illness. She was evaluated in the context of the global COVID-19 pandemic, which necessitated consideration that the patient might be at risk for infection with the SARS-CoV-2 virus that causes COVID-19. Institutional protocols and algorithms that pertain to the evaluation of patients at risk for COVID-19 are in a state of rapid change based on information released by regulatory bodies including the CDC and federal and state organizations. These policies and algorithms were followed during the patient's care in the ED.  Some ED evaluations and interventions may be delayed as a  result of limited staffing during the pandemic.*  Note:  This document was prepared using Dragon voice recognition software and may include unintentional dictation errors.   Darci Current, MD 07/28/19 [phone]

## 2019-07-28 NOTE — ED Notes (Addendum)
This RN checked to see if patient had finished her contrast. Pt finished one bottle and is working on the second now. Pt comfortable in room with S.O. at bedside. Denies further needs.

## 2019-07-28 NOTE — ED Notes (Signed)
Pt resting quietly on stretcher in darkened exam room with eyes closed; resp even/unlab with no distress noted 

## 2019-12-06 ENCOUNTER — Ambulatory Visit: Payer: Self-pay

## 2019-12-07 ENCOUNTER — Encounter: Payer: Self-pay | Admitting: Family Medicine

## 2019-12-07 ENCOUNTER — Other Ambulatory Visit: Payer: Self-pay

## 2019-12-07 ENCOUNTER — Ambulatory Visit: Payer: Self-pay | Admitting: Family Medicine

## 2019-12-07 DIAGNOSIS — B373 Candidiasis of vulva and vagina: Secondary | ICD-10-CM

## 2019-12-07 DIAGNOSIS — Z113 Encounter for screening for infections with a predominantly sexual mode of transmission: Secondary | ICD-10-CM

## 2019-12-07 DIAGNOSIS — B009 Herpesviral infection, unspecified: Secondary | ICD-10-CM

## 2019-12-07 DIAGNOSIS — B3731 Acute candidiasis of vulva and vagina: Secondary | ICD-10-CM

## 2019-12-07 LAB — WET PREP FOR TRICH, YEAST, CLUE: Trichomonas Exam: NEGATIVE

## 2019-12-07 MED ORDER — FLUCONAZOLE 150 MG PO TABS: 150.0000 mg | ORAL_TABLET | Freq: Once | ORAL | 0 refills | Status: AC

## 2019-12-07 MED ORDER — MICONAZOLE NITRATE 2 % EX CREA: 1.0000 "application " | TOPICAL_CREAM | Freq: Two times a day (BID) | CUTANEOUS | 0 refills | Status: DC

## 2019-12-07 NOTE — Progress Notes (Signed)
[facility] Department STI clinic/screening visit  Subjective:  Shari Spears is a 44 y.o. female being seen today for an STI screening visit. The patient reports they do have symptoms.  Patient reports that they do not desire a pregnancy in the next year.   They reported they are not interested in discussing contraception today.  No LMP recorded. (Menstrual status: Irregular Periods).   Patient has the following medical conditions:   Patient Active Problem List   Diagnosis Date Noted  . Obesity 10/07/2018  . Herpes 08/24/2018  . Cervical dysplasia 10/26/2013    No chief complaint on file.   HPI  Patient reports that she has had genital itching and  thick, white, curdy discharge x 4 days- thinks that it is a yeast infection. She has been using a gloved hand to scratch her perineum because the itching is intense at night.  She has used a cream in the past to treat.  Also she  has a painful, red rash in her perineum area x 10 days.  She thinks that there are lesions down there because she also has a h/o HSV2.  She states that she doesn't have her Acyclovir meds to take.  Client has an appointment with her PCP Phineas Real) next month and plans to ask for the RX. Client has T2DM-stats her BS range from 105-120 most days.  She is on Lantus  Last HIV test per patient/review of record was per client 2021.  Patient reports last pap was 2021 per client..   See flowsheet for further details and programmatic requirements.    The following portions of the patient's history were reviewed and updated as appropriate: allergies, current medications, past medical history, past social history, past surgical history and problem list.  Objective:  There were no vitals filed for this visit.  Physical Exam Vitals and nursing note reviewed.  Constitutional:      Appearance: Normal appearance.  HENT:     Head: Normocephalic and atraumatic.     Mouth/Throat:     Mouth:  Mucous membranes are moist.     Pharynx: Oropharynx is clear. No oropharyngeal exudate or posterior oropharyngeal erythema.  Pulmonary:     Effort: Pulmonary effort is normal.  Abdominal:     General: Abdomen is flat.     Palpations: There is no mass.     Tenderness: There is no abdominal tenderness. There is no rebound.  Genitourinary:    General: Normal vulva.     Exam position: Lithotomy position.     Pubic Area: Rash present. No pubic lice.      Labia:        Right: Rash and tenderness present. No lesion.        Left: Rash and tenderness present. No lesion.      Vagina: Vaginal discharge present. No erythema, bleeding or lesions.     Cervix: No cervical motion tenderness, discharge, friability, lesion or erythema.     Rectum: Normal.     Comments: Small amount, white discharge, pH 4.5 Perineum- erythematous area with excoriations noted on labial folds, groin area. No discharge noted from excoriation No lesions HSV lesions noted on exam Client deferred pelvic exam d/t to pain. Lymphadenopathy:     Head:     Right side of head: No preauricular or posterior auricular adenopathy.     Left side of head: No preauricular or posterior auricular adenopathy.     Cervical: No cervical adenopathy.     Upper  Body:     Right upper body: No supraclavicular or axillary adenopathy.     Left upper body: No supraclavicular or axillary adenopathy.     Lower Body: No right inguinal adenopathy. No left inguinal adenopathy.  Skin:    General: Skin is warm and dry.     Findings: No rash.  Neurological:     Mental Status: She is alert and oriented to person, place, and time.    Assessment and Plan:  Shari Spears is a 44 y.o. female presenting to the [facility] Department for STI screening  1. Screening examination for venereal disease  - WET PREP FOR TRICH, YEAST, CLUE - Chlamydia/Gonorrhea [place] [facility] -Client before her bloodwork was done.  2. H/O Herpes simplex  type 2 infection No visible lesions noted on exam.  3. Vulvovaginitis due to yeast infection Severe infection plan to treat with po meds and  external cream. - fluconazole (DIFLUCAN) 150 MG tablet; Take 1 tablet (150 mg total) by mouth once for 1 dose.  Dispense: 1 tablet; Refill: 0 -miconazole (Micatin) 2% cream; Apply 1 application topically 2 (two) times daily.  Dispense 28.35 g: Refill 0 Client instructed that she may need another tube of Miconazole to complete treatment.    No follow-ups on file.  No future appointments.  Larene Pickett, FNP

## 2019-12-07 NOTE — Progress Notes (Signed)
Provider orders completed. 

## 2021-03-13 ENCOUNTER — Other Ambulatory Visit: Payer: Self-pay

## 2021-03-13 ENCOUNTER — Emergency Department
Admission: EM | Admit: 2021-03-13 | Discharge: 2021-03-13 | Disposition: A | Discharge status: Home / Self Care | Payer: Medicaid Other | Attending: Emergency Medicine | Admitting: Emergency Medicine

## 2021-03-13 ENCOUNTER — Encounter: Payer: Self-pay | Admitting: *Deleted

## 2021-03-13 DIAGNOSIS — Z5321 Procedure and treatment not carried out due to patient leaving prior to being seen by health care provider: Secondary | ICD-10-CM | POA: Insufficient documentation

## 2021-03-13 DIAGNOSIS — M791 Myalgia, unspecified site: Secondary | ICD-10-CM | POA: Diagnosis not present

## 2021-03-13 DIAGNOSIS — W002XXA Other fall from one level to another due to ice and snow, initial encounter: Secondary | ICD-10-CM | POA: Diagnosis not present

## 2021-03-13 DIAGNOSIS — M7989 Other specified soft tissue disorders: Secondary | ICD-10-CM | POA: Insufficient documentation

## 2021-03-13 DIAGNOSIS — S59911A Unspecified injury of right forearm, initial encounter: Secondary | ICD-10-CM | POA: Diagnosis present

## 2021-03-13 DIAGNOSIS — S50812A Abrasion of left forearm, initial encounter: Secondary | ICD-10-CM | POA: Diagnosis not present

## 2021-03-13 NOTE — ED Notes (Signed)
No answer when called several times from lobby; no answer when phone # listed in chart called 

## 2021-03-13 NOTE — ED Notes (Signed)
No answer when called several times from lobby 

## 2021-03-13 NOTE — ED Triage Notes (Signed)
Pt slipped on the ice and fell.  Pt has swelling to right ankle and abrasions to left forearm.  Pt also has pain in buttocks.  No loc.  Pt tearful, alert.

## 2021-10-27 ENCOUNTER — Emergency Department
Admission: EM | Admit: 2021-10-27 | Discharge: 2021-10-28 | Disposition: A | Discharge status: Home / Self Care | Payer: Medicaid Other | Attending: Student in an Organized Health Care Education/Training Program | Admitting: Student in an Organized Health Care Education/Training Program

## 2021-10-27 ENCOUNTER — Other Ambulatory Visit: Payer: Self-pay

## 2021-10-27 ENCOUNTER — Emergency Department: Payer: Medicaid Other

## 2021-10-27 ENCOUNTER — Encounter: Payer: Self-pay | Admitting: Emergency Medicine

## 2021-10-27 DIAGNOSIS — Z91040 Latex allergy status: Secondary | ICD-10-CM | POA: Insufficient documentation

## 2021-10-27 DIAGNOSIS — E119 Type 2 diabetes mellitus without complications: Secondary | ICD-10-CM | POA: Diagnosis not present

## 2021-10-27 DIAGNOSIS — J039 Acute tonsillitis, unspecified: Secondary | ICD-10-CM | POA: Diagnosis not present

## 2021-10-27 DIAGNOSIS — D649 Anemia, unspecified: Secondary | ICD-10-CM | POA: Diagnosis not present

## 2021-10-27 DIAGNOSIS — I10 Essential (primary) hypertension: Secondary | ICD-10-CM | POA: Diagnosis not present

## 2021-10-27 DIAGNOSIS — J029 Acute pharyngitis, unspecified: Secondary | ICD-10-CM | POA: Diagnosis present

## 2021-10-27 LAB — COMPREHENSIVE METABOLIC PANEL
ALT: 17 U/L (ref 0–44)
AST: 22 U/L (ref 15–41)
Albumin: 3.7 g/dL (ref 3.5–5.0)
Alkaline Phosphatase: 102 U/L (ref 38–126)
Anion gap: 5 (ref 5–15)
BUN: 13 mg/dL (ref 6–20)
CO2: 23 mmol/L (ref 22–32)
Calcium: 8 mg/dL — ABNORMAL LOW (ref 8.9–10.3)
Chloride: 106 mmol/L (ref 98–111)
Creatinine, Ser: 0.42 mg/dL — ABNORMAL LOW (ref 0.44–1.00)
GFR, Estimated: >60 mL/min (ref >60)
Glucose, Bld: 274 mg/dL — ABNORMAL HIGH (ref 70–99)
Potassium: 3.3 mmol/L — ABNORMAL LOW (ref 3.5–5.1)
Sodium: 134 mmol/L — ABNORMAL LOW (ref 135–145)
Total Bilirubin: 0.5 mg/dL (ref 0.3–1.2)
Total Protein: 7.6 g/dL (ref 6.5–8.1)

## 2021-10-27 LAB — CBC WITH DIFFERENTIAL/PLATELET
Abs Immature Granulocytes: 0.01 10*3/uL (ref 0.00–0.07)
Basophils Absolute: 0 10*3/uL (ref 0.0–0.1)
Basophils Relative: 0 %
Eosinophils Absolute: 0 10*3/uL (ref 0.0–0.5)
Eosinophils Relative: 1 %
HCT: 28.2 % — ABNORMAL LOW (ref 36.0–46.0)
Hemoglobin: 7.7 g/dL — ABNORMAL LOW (ref 12.0–15.0)
Immature Granulocytes: 0 %
Lymphocytes Relative: 40 %
Lymphs Abs: 2.4 10*3/uL (ref 0.7–4.0)
MCH: 16.6 pg — ABNORMAL LOW (ref 26.0–34.0)
MCHC: 27.3 g/dL — ABNORMAL LOW (ref 30.0–36.0)
MCV: 60.8 fL — ABNORMAL LOW (ref 80.0–100.0)
Monocytes Absolute: 0.4 10*3/uL (ref 0.1–1.0)
Monocytes Relative: 7 %
Neutro Abs: 3 10*3/uL (ref 1.7–7.7)
Neutrophils Relative %: 52 %
Platelets: 329 10*3/uL (ref 150–400)
RBC: 4.64 MIL/uL (ref 3.87–5.11)
RDW: 19.9 % — ABNORMAL HIGH (ref 11.5–15.5)
WBC: 5.9 10*3/uL (ref 4.0–10.5)
nRBC: 0 % (ref 0.0–0.2)

## 2021-10-27 LAB — LACTIC ACID, PLASMA: Lactic Acid, Venous: 1.2 mmol/L (ref 0.5–1.9)

## 2021-10-27 MED ORDER — SODIUM CHLORIDE 0.9 % IV BOLUS
1000.0000 mL | Freq: Once | INTRAVENOUS | Status: AC
  Administered 2021-10-27: 1000 mL via INTRAVENOUS

## 2021-10-27 MED ORDER — DEXAMETHASONE SODIUM PHOSPHATE 10 MG/ML IJ SOLN
10.0000 mg | Freq: Once | INTRAMUSCULAR | Status: AC
  Administered 2021-10-27: 10 mg via INTRAVENOUS
  Filled 2021-10-27: qty 1

## 2021-10-27 MED ORDER — IOHEXOL 300 MG/ML  SOLN
75.0000 mL | Freq: Once | INTRAMUSCULAR | Status: AC | PRN
  Administered 2021-10-27: 75 mL via INTRAVENOUS

## 2021-10-27 NOTE — ED Triage Notes (Signed)
Pt via POV from home. Pt was seen on Friday at [facility] walk in for tonsil stones and tonsillitis, pt was placed on antibiotics. States that since then she is having headache and fever. Pt is A&OX4 and NAD  Pt states she feels febrile and took Tylenol 2 hours ago.  Spanish interpreter needed.

## 2021-10-27 NOTE — Discharge Instructions (Addendum)
You are significantly anemic and need to be sure that you follow-up with the OB/GYN or primary care as soon as possible.  I will also give you information for hematology in hopes that they may can see you faster.  [facility] [phone]  In the meantime you should start to take an iron supplement daily.  Iron can be very constipating so you will need to be sure that you are drinking at least 64 ounces of water a day and taking a stool softener.  Your glucose level was also found to be elevated which could mean that you have diabetes.  You will need to follow-up with your primary care provider to have further evaluation done.  Your calcium level is low.  You should take Tums and a vitamin D supplement daily.  You have requested the number for ENT because you would like to have her tonsils removed.  I have provided that information to you today in your discharge.

## 2021-10-27 NOTE — ED Provider Notes (Signed)
-----------------------------------------   11:49 PM on 10/27/2021 -----------------------------------------  History limited by Spanish language.  Tele-interpreter used for interview and exam.  Blood pressure 127/81, pulse 89, temperature 99.1 F (37.3 C), temperature source Oral, resp. rate 20, height 4\' 11"  (1.499 m), weight 81.6 kg, SpO2 99 %.  Assuming care from Dr. , PA-C/NP-C.  In short, Shari Spears is a 46 y.o. female with a chief complaint of Fever and Sore Throat .  Refer to the original H&P for additional details.  The current plan of care is to await CT results and disposition the patient accordingly..  ____________________________________________   LABS (pertinent positives/negatives) Labs Reviewed  CBC WITH DIFFERENTIAL/PLATELET - Abnormal; Notable for the following components:      Result Value   Hemoglobin 7.7 (*)    HCT 28.2 (*)    MCV 60.8 (*)    MCH 16.6 (*)    MCHC 27.3 (*)    RDW 19.9 (*)    All other components within normal limits  COMPREHENSIVE METABOLIC PANEL - Abnormal; Notable for the following components:   Sodium 134 (*)    Potassium 3.3 (*)    Glucose, Bld 274 (*)    Creatinine, Ser 0.42 (*)    Calcium 8.0 (*)    All other components within normal limits  LACTIC ACID, PLASMA  LACTIC ACID, PLASMA   ____________________________________________   RADIOLOGY  CT Soft Tissue Neck w/ CM  IMPRESSION: 1. Mildly prominent 1.1 cm right level Ib lymph node, indeterminate, but may be reactive. 2. Otherwise negative CT of the neck. No other acute inflammatory changes evident by CT. No other mass or adenopathy.   Electronically Signed By: 55 M.D. On: 10/27/2021 20:17  ____________________________________________  PROCEDURES  Decadron 10 mg IVP  Procedures ____________________________________________  INITIAL IMPRESSION / ASSESSMENT AND PLAN / ED COURSE   Patient to the ED for evaluation of lower  throat and tonsilloliths.  Patient was evaluated in local urgent care and treated empirically for tonsillitis.  She was evaluated for her complaints in the ED, and found to have overall reassuring work-up.  Further evaluation with CT imaging does not reveal any acute infectious process.  No evidence of PTA or airway compromise.  Patient stable at this time for discharge home with the continued antibiotic course.  She will follow-up with [facility] for ongoing symptoms.  Return precautions been reviewed. ____________________________________________  FINAL CLINICAL IMPRESSION(S) / ED DIAGNOSES  Final diagnoses:  Tonsillitis      Suzzane Quilter, 10/29/2021, PA-C 10/27/21 2352    10/29/21, MD 10/28/21 0430

## 2021-10-27 NOTE — ED Provider Notes (Signed)
[facility] Emergency Department Provider Note   ____________________________________________   Event Date/Time   First MD Initiated Contact with Patient 10/27/21 1755     (approximate)  I have reviewed the triage vital signs and the nursing notes.   HISTORY  Chief Complaint Fever and Sore Throat    HPI Shari Spears is a 46 y.o. female presents to the emergency room for fever/worsening sore throat.  Patient was seen at [facility] urgent care 2 days ago.  She was seen for complaint of sore throat and headache.  She was diagnosed with strep throat and discharged with prescription for Augmentin.  She was encouraged to push fluids. Incidentally they also noted that she was significantly anemic and told her to follow-up with OB/GYN or PCP when she could establish with a either.  Today patient reports back to the emergency room stating she has significant amount of increased pain in her throat/tonsils.  She also reports that she is having a significant amount of pain in the left side of her neck extending up into her face/head.  This is causing her pain in her temple region as well.  She reports that she is having a headache with pressure to the left side of her head. Patient reports that she is having difficulty swallowing related to the pain.  She also is continuing to run a fever of 101 at home unless she has taken Tylenol or ibuprofen continuously. She reports that she is having chills/fatigue/feels weak. Patient rates the pain a 10 out of 10.  With no relief from Tylenol or ibuprofen.     Past Medical History:  Diagnosis Date   Colitis    Diabetes mellitus without complication (HCC)    Hypertension     Patient Active Problem List   Diagnosis Date Noted   Obesity 10/07/2018   Herpes 08/24/2018   Cervical dysplasia 10/26/2013    Past Surgical History:  Procedure Laterality Date   CESAREAN SECTION     HERNIA REPAIR       Prior to Admission medications   Medication Sig Start Date End Date Taking? Authorizing Provider  miconazole (MICATIN) 2 % cream Apply 1 application topically 2 (two) times daily. 12/07/19   Larene Pickett, FNP  promethazine (PHENERGAN) 25 MG tablet Take 1 tablet (25 mg total) by mouth every 6 (six) hours as needed for nausea or vomiting. 09/11/16   Emily Filbert, MD  sucralfate (CARAFATE) 1 g tablet Take 1 tablet (1 g total) by mouth 4 (four) times daily. 09/11/16 09/11/17  Emily Filbert, MD    Allergies Latex  Family History  Problem Relation Age of Onset   Diabetes Mother    Hypertension Mother    Alcohol abuse Father    Asthma Daughter     Social History Social History   Tobacco Use   Smoking status: Never   Smokeless tobacco: Never  Vaping Use   Vaping Use: Never used  Substance Use Topics   Alcohol use: No   Drug use: No    Review of Systems  Constitutional: Positive for headache/chills/weakness/headache Eyes: No visual changes. ENT: Positive for sore throat Cardiovascular: Denies chest pain. Respiratory: Denies shortness of breath. Gastrointestinal: No abdominal pain.  No diarrhea.  No constipation.  Today for nausea Genitourinary: Negative for dysuria. Musculoskeletal: Negative for back pain. Skin: Negative for rash. Neurological: Negative for focal weakness or numbness.  Positive for headache   ____________________________________________   PHYSICAL EXAM:  VITAL SIGNS: ED  Triage Vitals  Enc Vitals Group     BP 10/27/21 1655 127/81     Pulse Rate 10/27/21 1655 89     Resp 10/27/21 1655 20     Temp 10/27/21 1655 99.1 F (37.3 C)     Temp Source 10/27/21 1655 Oral     SpO2 10/27/21 1655 99 %     Weight 10/27/21 1657 180 lb (81.6 kg)     Height 10/27/21 1656 4\' 11"  (1.499 m)     Head Circumference --      Peak Flow --      Pain Score --      Pain Loc --      Pain Edu? --      Excl. in GC? --     Constitutional: Alert and  oriented.  Patient is ill-appearing Eyes: Conjunctivae are normal. PERRL. EOMI. Head: Atraumatic. Nose: No congestion/rhinnorhea. Mouth/Throat: Mucous membranes are moist.  Oropharynx is erythematous.  Tonsils are noted to be 2+ on left and 3+ on right.  There are multiple tonsil stones noted on the right.  There is exudate noted to bilateral tonsils. Neck: Cervical lymphadenopathy noted.  More prominent to the left.  Patient is significantly tender to palpation. Cardiovascular: Normal rate, regular rhythm. Grossly normal heart sounds.  Good peripheral circulation. Respiratory: Normal respiratory effort.  No retractions. Lungs CTAB. Gastrointestinal: Soft and nontender. No distention. No abdominal bruits. No CVA tenderness. Musculoskeletal: No lower extremity tenderness nor edema.  No joint effusions. Neurologic:  Normal speech and language. No gross focal neurologic deficits are appreciated. No gait instability. Skin:  Skin is warm, dry and intact. No rash noted. Psychiatric: Mood and affect are normal. Speech and behavior are normal.  ____________________________________________   LABS (all labs ordered are listed, but only abnormal results are displayed)  Labs Reviewed - No data to display ____________________________________________  EKG   ____________________________________________  RADIOLOGY  ED MD interpretation:    Official radiology report(s): No results found.  ____________________________________________   PROCEDURES  Procedure(s) performed: None  Procedures  Critical Care performed: No  ____________________________________________   INITIAL IMPRESSION / ASSESSMENT AND PLAN / ED COURSE     Shari Spears is a 46 y.o. female presents to the emergency room for fever/worsening sore throat.  Patient was seen at [facility] clinic urgent care 2 days ago.  She was seen for complaint of sore throat and headache.  She was diagnosed with strep throat  and discharged with prescription for Augmentin.  She was encouraged to push fluids.  Today patient reports back to the emergency room stating she has significant amount of increased pain in her throat/tonsils.  She also reports that she is having a significant amount of pain in the left side of her neck extending up into her face/head.  This is causing her pain in her temple region as well.  She reports that she is having a headache with pressure to the left side of her head. Patient reports that she is having difficulty swallowing related to the pain.  She also is continuing to run a fever of 101 at home unless she has taken Tylenol or ibuprofen continuously. She reports that she is having chills/fatigue/feels weak. Patient rates the pain a 10 out of 10.  With no relief from Tylenol or ibuprofen.  See physical exam for details.  CT soft tissue neck ordered. We will also obtain CBC, CMP, lactic acid.  Will give saline bolus of the 1000 mL.  Lactic acid is normal.  Patient CBC shows white count that is reassuring at 5.9.  However, her hemoglobin is 7.7 which is slightly lower than 7.9 days ago. Status of anemia was discussed with Dr. Roxan Hockey and plan below was agreed upon.  Clinical Course as of 10/27/21 Cathie Olden Oct 27, 2021  2683 Sodium(!): 134 [TC]    Clinical Course User Index [TC] Herschell Dimes, NP   Incidentally urgent care also noted that she was significantly anemic and told her to follow-up with OB/GYN or PCP when she could establish with a either. Patient denies any vaginal bleeding or blood in stool.  She denies any bloody emesis.  She denies shortness of breath, dyspnea on exertion, dizziness.  Patient is not tachycardic.  She denies fatigue/weakness. I have encouraged her to be sure and follow-up with OB/GYN and PCP.  She reports that she has an appointment with both in the next month. I will have her to start taking an iron supplement daily.  I have discussed with her that  she will need to make sure she drinks an adequate amount of water and takes a stool softener daily.  I have also provided her with the number for benign hematology at [facility] in the event that they may can see her sooner than her new PCP appointment. I have also discussed with her that if she becomes symptomatic, which I have discussed with her, that she should report to the emergency room immediately in regards to the anemia.  Patient is also noted to have a glucose of 274.  She reports that she does not have a history of diabetes.  I have discussed with her that she will need to be sure and follow-up with PCP for management and further evaluation.  Patient's calcium is slightly low I discussed with her that she will need to take a Tums daily.  And discuss this further with her PCP at her new patient appointment.  9:10 PM report is given to [facility] we are still awaiting CT soft tissue neck with contrast results.  She is taking over care at this time.  ____________________________________________   FINAL CLINICAL IMPRESSION(S) / ED DIAGNOSES  Final diagnoses:  None     ED Discharge Orders     None        Note:  This document was prepared using Dragon voice recognition software and may include unintentional dictation errors.     Herschell Dimes, NP 10/27/21 2108    Willy Eddy, MD 10/28/21 1346

## 2021-10-28 NOTE — ED Notes (Signed)
E signature pad not working. Pt educated on discharge instructions and verbalized understanding.  

## 2021-12-19 ENCOUNTER — Encounter: Payer: Self-pay | Admitting: Emergency Medicine

## 2021-12-19 ENCOUNTER — Other Ambulatory Visit: Payer: Self-pay

## 2021-12-19 ENCOUNTER — Emergency Department
Admission: EM | Admit: 2021-12-19 | Discharge: 2021-12-19 | Disposition: A | Discharge status: Home / Self Care | Payer: Medicaid Other | Attending: Emergency Medicine | Admitting: Emergency Medicine

## 2021-12-19 DIAGNOSIS — U071 COVID-19: Secondary | ICD-10-CM | POA: Insufficient documentation

## 2021-12-19 DIAGNOSIS — R0981 Nasal congestion: Secondary | ICD-10-CM | POA: Diagnosis present

## 2021-12-19 DIAGNOSIS — J069 Acute upper respiratory infection, unspecified: Secondary | ICD-10-CM

## 2021-12-19 LAB — RESP PANEL BY RT-PCR (FLU A&B, COVID) ARPGX2
Influenza A by PCR: NEGATIVE
Influenza B by PCR: NEGATIVE
SARS Coronavirus 2 by RT PCR: POSITIVE — AB

## 2021-12-19 NOTE — ED Triage Notes (Signed)
Pt via POV from home. Pt c/o cough, congestion, loss of taste, and fever for the last 2 days. Pt states her job won't let her back without a COVID test. Pt is A&Ox4 and NAD

## 2021-12-19 NOTE — ED Provider Notes (Signed)
[facility] Provider Note  Patient Contact: 5:35 PM (approximate)   History   Cough   HPI  Shari Spears is a 46 y.o. female presents to the emergency department with cough, nasal congestion and body aches for the past 2 days.  Patient requires a COVID-19 test by work.  No chest pain, chest tightness or shortness of breath.      Physical Exam   Triage Vital Signs: ED Triage Vitals  Enc Vitals Group     BP 12/19/21 1708 100/69     Pulse Rate 12/19/21 1708 88     Resp 12/19/21 1708 18     Temp 12/19/21 1708 98.6 F (37 C)     Temp src --      SpO2 12/19/21 1708 95 %     Weight 12/19/21 1656 178 lb (80.7 kg)     Height 12/19/21 1656 4\' 11"  (1.499 m)     Head Circumference --      Peak Flow --      Pain Score 12/19/21 1656 0     Pain Loc --      Pain Edu? --      Excl. in GC? --     Most recent vital signs: Vitals:   12/19/21 1708  BP: 100/69  Pulse: 88  Resp: 18  Temp: 98.6 F (37 C)  SpO2: 95%     Constitutional: Alert and oriented. Patient is lying supine. Eyes: Conjunctivae are normal. PERRL. EOMI. Head: Atraumatic. ENT:      Ears: Tympanic membranes are mildly injected with mild effusion bilaterally.       Nose: No congestion/rhinnorhea.      Mouth/Throat: Mucous membranes are moist. Posterior pharynx is mildly erythematous.  Hematological/Lymphatic/Immunilogical: No cervical lymphadenopathy.  Cardiovascular: Normal rate, regular rhythm. Normal S1 and S2.  Good peripheral circulation. Respiratory: Normal respiratory effort without tachypnea or retractions. Lungs CTAB. Good air entry to the bases with no decreased or absent breath sounds. Gastrointestinal: Bowel sounds 4 quadrants. Soft and nontender to palpation. No guarding or rigidity. No palpable masses. No distention. No CVA tenderness. Musculoskeletal: Full range of motion to all extremities. No gross deformities appreciated. Neurologic:  Normal speech and  language. No gross focal neurologic deficits are appreciated.  Skin:  Skin is warm, dry and intact. No rash noted. Psychiatric: Mood and affect are normal. Speech and behavior are normal. Patient exhibits appropriate insight and judgement.   ED Results / Procedures / Treatments   Labs (all labs ordered are listed, but only abnormal results are displayed) Labs Reviewed  RESP PANEL BY RT-PCR (FLU A&B, COVID) [id number]       PROCEDURES:  Critical Care performed: No  Procedures   MEDICATIONS ORDERED IN ED: Medications - No data to display   IMPRESSION / MDM / ASSESSMENT AND PLAN / ED COURSE  I reviewed the triage vital signs and the nursing notes.                              Assessment and plan Viral URI with cough 46 year old female presents to the emergency department with cough for the past 2 days, nasal congestion and body aches.  Vital signs are reassuring at triage.  On exam, patient was alert and nontoxic-appearing with no increased work of breathing.  COVID-19 testing process and patient was advised to monitor her results through MyChart.  Recommended rest and hydration at home.  Tylenol  and ibuprofen alternating were recommended for headache and body aches and Flonase for nasal congestion.      FINAL CLINICAL IMPRESSION(S) / ED DIAGNOSES   Final diagnoses:  Viral URI with cough     Rx / DC Orders   ED Discharge Orders     None        Note:  This document was prepared using Dragon voice recognition software and may include unintentional dictation errors.   Vallarie Mare [place], PA-C 12/19/21 1736    Harvest Dark, MD 12/19/21 1906

## 2021-12-19 NOTE — Discharge Instructions (Addendum)
Take one spray of Flonase each side. (Over the counter medicine) You can check Covid 19 results through [place].

## 2022-03-04 ENCOUNTER — Ambulatory Visit: Payer: Medicaid Other | Admitting: Family Medicine

## 2022-03-04 ENCOUNTER — Encounter: Payer: Self-pay | Admitting: Family Medicine

## 2022-03-04 ENCOUNTER — Ambulatory Visit (LOCAL_COMMUNITY_HEALTH_CENTER): Payer: Medicaid Other | Admitting: Family Medicine

## 2022-03-04 VITALS — BP 113/73 | Ht 59.0 in | Wt 185.0 lb

## 2022-03-04 DIAGNOSIS — Z309 Encounter for contraceptive management, unspecified: Secondary | ICD-10-CM | POA: Diagnosis not present

## 2022-03-04 DIAGNOSIS — E1169 Type 2 diabetes mellitus with other specified complication: Secondary | ICD-10-CM

## 2022-03-04 DIAGNOSIS — Z0000 Encounter for general adult medical examination without abnormal findings: Secondary | ICD-10-CM

## 2022-03-04 DIAGNOSIS — N879 Dysplasia of cervix uteri, unspecified: Secondary | ICD-10-CM

## 2022-03-04 NOTE — Progress Notes (Signed)
[facility] [address] Main Number: [phone]  Family Planning Visit- Repeat Yearly Visit  Subjective:  Shari Spears is a 46 y.o. No obstetric history on file.  being seen today for an annual wellness visit and to discuss contraception options.   The patient is currently using Hormonal Implant for pregnancy prevention. Patient does not want a pregnancy in the next year.    report they are looking for a method that provides Ready when they are    Patient has the following medical problems: has Cervical dysplasia; Herpes; and Obesity, BMI 37.37 on their problem list.  Chief Complaint  Patient presents with   Contraception    Physical and nexplanon removal    Annual Exam    Physical    Patient reports to the clinic for nexplanon removal. She had it placed in 02/2019.   Patient denies any problems with her implant.  See flowsheet for other program required questions.   Body mass index is 37.37 kg/m. - Patient is eligible for diabetes screening based on BMI> 25 and age >35?  yes HA1C ordered? No- patient will get this at her other doctor in January   Patient reports 1 of partners in last year. Desires STI screening?  No - patient states she does not have time today.   Has patient been screened once for HCV in the past?  No   Lab Results  Component Value Date   HCVAB NON REACTIVE 07/28/2019    Does the patient have current of drug use, have a partner with drug use, and/or has been incarcerated since last result? No  If yes-- Screen for HCV through [facility]   Does the patient meet criteria for HBV testing? No  Criteria:  -Household, sexual or needle sharing contact with HBV -History of drug use -HIV positive -Those with known Hep C   Health Maintenance Due  Topic Date Due   COVID-19 Vaccine (1) Never done   DTaP/Tdap/Td (1 - Tdap) Never done   PAP SMEAR-Modifier  05/23/2016    COLONOSCOPY (Pts 45-52yrs Insurance coverage will need to be confirmed)  Never done   INFLUENZA VACCINE  10/15/2021    Review of Systems  Gastrointestinal:  Positive for nausea.    The following portions of the patient's history were reviewed and updated as appropriate: allergies, current medications, past family history, past medical history, past social history, past surgical history and problem list. Problem list updated.  Objective:   Vitals:   03/04/22 1509  BP: 113/73  Weight: 185 lb (83.9 kg)  Height: 4\' 11"  (1.499 m)    Physical Exam Vitals and nursing note reviewed.  Constitutional:      Appearance: Normal appearance.  HENT:     Head: Normocephalic and atraumatic.  Pulmonary:     Effort: Pulmonary effort is normal.  Genitourinary:    Comments: Declined genital exam Lymphadenopathy:     Head:     Right side of head: No preauricular or posterior auricular adenopathy.     Left side of head: No preauricular or posterior auricular adenopathy.     Cervical: No cervical adenopathy.     Upper Body:     Right upper body: No supraclavicular, axillary or epitrochlear adenopathy.     Left upper body: No supraclavicular, axillary or epitrochlear adenopathy.  Skin:    General: Skin is warm and dry.     Findings: No rash.  Neurological:  Mental Status: She is alert and oriented to person, place, and time.   Patient needed to leave d/t childcare concern, and entire physical not performed.    Assessment and Plan:  Shari Spears is a 46 y.o. female No obstetric history on file. presenting to the [facility] Department for an yearly wellness and contraception visit   Contraception counseling: Reviewed options based on patient desire and reproductive life plan. Patient is interested in Hormonal Implant. This was not provided to the patient today. Patient already has this.   Risks, benefits, and typical effectiveness rates were reviewed.  Questions  were answered.  Written information was also given to the patient to review.    The patient will follow up in  1 years for surveillance.  The patient was told to call with any further questions, or with any concerns about this method of contraception.  Emphasized use of condoms 100% of the time for STI prevention.  Patient was assessed for need for ECP. Not indicated- implant in place.   1. Well woman exam (no gynecological exam) -Last pap smear was 04/08/19 (NIL, HPV negative). -Was going to do Pap test today, pt then states she has a PCP/gyne? Appointment in January, and they are doing her pap there. She said they are going to manage her blood sugar as well. -declined A1c today, STI testing, or pap smear. -Patient has had a lot of stress in life- would like referral to LCSW, tearful at times during visit.   2. Type 2 diabetes mellitus with other specified complication, without long-term current use of insulin (HCC)  Chart reviewed- per note from 04/08/2019, patient has T2DM, but no PCP. Pt states she has appointment coming up for PCP in January, wants to do A1c there.  3. Cervical Dysplasia Pt has had multiple LEEP, colpo/ abn paps. Last pap 04/08/19 was NIL, HPV negative. ASCCP guidelines- recommend referring to the algorithm.  I believe she needs one more NIL, HPV negative pap, then she can progress to the Q5 year regime. Pt declined to have pap test today- stating she has a new appointment with PCP in January, wants it done there.   Total time spent 30 minutes.  RTC in 1 year for Nexplanon removal   Lenice Llamas, [place]

## 2022-03-13 NOTE — Addendum Note (Signed)
Addended by: Lenice Llamas on: 03/13/2022 01:10 PM   Modules accepted: Level of Service

## 2022-05-08 ENCOUNTER — Other Ambulatory Visit: Payer: Self-pay

## 2022-05-13 ENCOUNTER — Ambulatory Visit: Payer: Medicaid Other | Admitting: Gastroenterology

## 2022-05-29 NOTE — Addendum Note (Signed)
Addended by: Sharlet Salina on: 05/29/2022 01:59 PM   Modules accepted: Level of Service

## 2022-12-08 ENCOUNTER — Emergency Department
Admission: EM | Admit: 2022-12-08 | Discharge: 2022-12-08 | Disposition: A | Discharge status: Home / Self Care | Payer: Medicaid Other | Attending: Emergency Medicine | Admitting: Emergency Medicine

## 2022-12-08 ENCOUNTER — Other Ambulatory Visit: Payer: Self-pay

## 2022-12-08 DIAGNOSIS — M791 Myalgia, unspecified site: Secondary | ICD-10-CM | POA: Diagnosis not present

## 2022-12-08 DIAGNOSIS — E1165 Type 2 diabetes mellitus with hyperglycemia: Secondary | ICD-10-CM | POA: Diagnosis not present

## 2022-12-08 DIAGNOSIS — R52 Pain, unspecified: Secondary | ICD-10-CM

## 2022-12-08 DIAGNOSIS — D649 Anemia, unspecified: Secondary | ICD-10-CM | POA: Insufficient documentation

## 2022-12-08 DIAGNOSIS — R739 Hyperglycemia, unspecified: Secondary | ICD-10-CM

## 2022-12-08 DIAGNOSIS — R519 Headache, unspecified: Secondary | ICD-10-CM | POA: Insufficient documentation

## 2022-12-08 LAB — BASIC METABOLIC PANEL
Anion gap: 7 (ref 5–15)
BUN: 10 mg/dL (ref 6–20)
CO2: 22 mmol/L (ref 22–32)
Calcium: 8.8 mg/dL — ABNORMAL LOW (ref 8.9–10.3)
Chloride: 106 mmol/L (ref 98–111)
Creatinine, Ser: 0.34 mg/dL — ABNORMAL LOW (ref 0.44–1.00)
GFR, Estimated: >60 mL/min (ref >60)
Glucose, Bld: 207 mg/dL — ABNORMAL HIGH (ref 70–99)
Potassium: 3.5 mmol/L (ref 3.5–5.1)
Sodium: 135 mmol/L (ref 135–145)

## 2022-12-08 LAB — CBC
HCT: 28.6 % — ABNORMAL LOW (ref 36.0–46.0)
Hemoglobin: 8.4 g/dL — ABNORMAL LOW (ref 12.0–15.0)
MCH: 18.5 pg — ABNORMAL LOW (ref 26.0–34.0)
MCHC: 29.4 g/dL — ABNORMAL LOW (ref 30.0–36.0)
MCV: 62.9 fL — ABNORMAL LOW (ref 80.0–100.0)
Platelets: 352 10*3/uL (ref 150–400)
RBC: 4.55 MIL/uL (ref 3.87–5.11)
RDW: 22.3 % — ABNORMAL HIGH (ref 11.5–15.5)
WBC: 6 10*3/uL (ref 4.0–10.5)
nRBC: 0 % (ref 0.0–0.2)

## 2022-12-08 LAB — CBG MONITORING, ED: Glucose-Capillary: 198 mg/dL — ABNORMAL HIGH (ref 70–99)

## 2022-12-08 MED ORDER — KETOROLAC TROMETHAMINE 15 MG/ML IJ SOLN
15.0000 mg | Freq: Once | INTRAMUSCULAR | Status: AC
  Administered 2022-12-08: 15 mg via INTRAVENOUS
  Filled 2022-12-08: qty 1

## 2022-12-08 MED ORDER — DIPHENHYDRAMINE HCL 50 MG/ML IJ SOLN
25.0000 mg | Freq: Once | INTRAMUSCULAR | Status: AC
  Administered 2022-12-08: 25 mg via INTRAVENOUS
  Filled 2022-12-08: qty 1

## 2022-12-08 MED ORDER — PROCHLORPERAZINE EDISYLATE 10 MG/2ML IJ SOLN
10.0000 mg | Freq: Once | INTRAMUSCULAR | Status: AC
  Administered 2022-12-08: 10 mg via INTRAVENOUS
  Filled 2022-12-08: qty 2

## 2022-12-08 MED ORDER — SODIUM CHLORIDE 0.9 % IV BOLUS
1000.0000 mL | Freq: Once | INTRAVENOUS | Status: AC
  Administered 2022-12-08: 1000 mL via INTRAVENOUS

## 2022-12-08 NOTE — Discharge Instructions (Addendum)
You were in the ER today for evaluation of your headache and bodyaches.  I am glad you are feeling better after medicines here.  Your testing was overall reassuring.  Please continue to follow-up with your primary care doctor for further evaluation of your anemia and diabetes.  Return to the ER for new or worsening symptoms.

## 2022-12-08 NOTE — ED Notes (Signed)
See triage note  Presents with some body aches which started last pm  Also states her blood sugar was elevated

## 2022-12-08 NOTE — ED Provider Notes (Signed)
[facility] Provider Note    Event Date/Time   First MD Initiated Contact with Patient 12/08/22 [phone]     (approximate)   History   Hyperglycemia   HPI  Shari Spears is a 47 year old female with history of diabetes presenting to the emergency department for evaluation of bodyaches.  Patient reports that she had been using an injectable medicine for her diabetes but could not tolerated so she recently switched to a pill form that she is tolerating better, but has had some bodyaches over the last several weeks, worse last night.  No fevers or chills.  No chest pain, shortness of breath.  Does report pain in her legs that has been ongoing as well as a headache, not sudden in onset or different from prior.  No associated focal numbness, tingling, weakness.  Is concerned that it may be related to high blood sugar as her blood glucose was over 200 after taking her medicine.      Physical Exam   Triage Vital Signs: ED Triage Vitals  Encounter Vitals Group     BP 12/08/22 0404 125/82     Systolic BP Percentile --      Diastolic BP Percentile --      Pulse Rate 12/08/22 0404 71     Resp 12/08/22 0404 18     Temp 12/08/22 0404 98.8 F (37.1 C)     Temp src --      SpO2 12/08/22 0404 99 %     Weight 12/08/22 0715 184 lb 15.5 oz (83.9 kg)     Height 12/08/22 0715 4\' 11"  (1.499 m)     Head Circumference --      Peak Flow --      Pain Score --      Pain Loc --      Pain Education --      Exclude from Growth Chart --     Most recent vital signs: Vitals:   12/08/22 0404 12/08/22 0815  BP: 125/82 120/80  Pulse: 71 68  Resp: 18 18  Temp: 98.8 F (37.1 C) 98 F (36.7 C)  SpO2: 99% 99%     General: Awake, interactive  CV:  Regular rate, good peripheral perfusion.  Resp:  Lungs clear, unlabored respirations.  Abd:  Soft, nondistended.  Neuro:  Alert and oriented, normal extraocular movements, symmetric facial movement, sensation intact  over bilateral upper and lower extremities with 5 out of 5 strength.  Normal finger-to-nose testing.   ED Results / Procedures / Treatments   Labs (all labs ordered are listed, but only abnormal results are displayed) Labs Reviewed  BASIC METABOLIC PANEL - Abnormal; Notable for the following components:      Result Value   Glucose, Bld 207 (*)    Creatinine, Ser 0.34 (*)    Calcium 8.8 (*)    All other components within normal limits  CBC - Abnormal; Notable for the following components:   Hemoglobin 8.4 (*)    HCT 28.6 (*)    MCV 62.9 (*)    MCH 18.5 (*)    MCHC 29.4 (*)    RDW 22.3 (*)    All other components within normal limits  CBG MONITORING, ED - Abnormal; Notable for the following components:   Glucose-Capillary 198 (*)    All other components within normal limits  URINALYSIS, ROUTINE W REFLEX MICROSCOPIC  CBG MONITORING, ED  POC URINE PREG, ED     EKG EKG independently reviewed  interpreted by myself (ER attending) demonstrates:    RADIOLOGY Imaging independently reviewed and interpreted by myself demonstrates:    PROCEDURES:  Critical Care performed: No  Procedures   MEDICATIONS ORDERED IN ED: Medications  sodium chloride 0.9 % bolus 1,000 mL (1,000 mLs Intravenous New Bag/Given 12/08/22 0801)  ketorolac (TORADOL) 15 MG/ML injection 15 mg (15 mg Intravenous Given 12/08/22 0801)  diphenhydrAMINE (BENADRYL) injection 25 mg (25 mg Intravenous Given 12/08/22 0801)  prochlorperazine (COMPAZINE) injection 10 mg (10 mg Intravenous Given 12/08/22 0801)     IMPRESSION / MDM / ASSESSMENT AND PLAN / ED COURSE  I reviewed the triage vital signs and the nursing notes.  Differential diagnosis includes, but is not limited to, hyperglycemia with or without DKA, anemia, electrolyte abnormality, viral illness  Patient's presentation is most consistent with acute complicated illness / injury requiring diagnostic workup.  47 year old female presenting to the emergency  department for evaluation of bodyaches.  Labs from triage with stable anemia with hemoglobin of 8.4, though anemia certainly could be contributing to her fatigue.  Is on iron pills for this.  BMP with elevated glucose at 207 with normal anion gap and bicarb, not consistent with DKA.  Will treat symptomatically for body aches and headache with IV fluids, Toradol, Benadryl, Compazine.  No focal deficits, do not think there is an indication for imaging currently.  Clinical Course as of 12/08/22 0850  [facility] Dec 08, 2022  0848 Patient reevaluated.  Headache and leg pain resolved.  Eager to be discharged home.  Able to ambulate with steady gait.  Has a ride home.  Do think she is stable for discharge with outpatient follow-up for continued evaluation of her anemia and diabetes.  Strict return precautions provided.  Patient discharged in stable condition. [NR]    Clinical Course User Index [NR] Trinna Post, MD     FINAL CLINICAL IMPRESSION(S) / ED DIAGNOSES   Final diagnoses:  Hyperglycemia  Body aches  Acute nonintractable headache, unspecified headache type     Rx / DC Orders   ED Discharge Orders     None        Note:  This document was prepared using Dragon voice recognition software and may include unintentional dictation errors.   Trinna Post, MD 12/08/22 [phone]

## 2022-12-08 NOTE — ED Triage Notes (Addendum)
Pt to ED via [facility] c/o high blood sugar and body aches. Pt reports body aches that started around 10pm last night and states that they are due to sugar being high. Pt afebrile. Denies CP, SOB, fevers. A&Ox4. Hx of diabetes.    150/89 80HR 100%RA 97.9 CBG 205
